# Patient Record
Sex: Female | Born: 1965 | Race: White | Hispanic: No | Marital: Married | Smoking: Former smoker
Health system: Southern US, Community
[De-identification: ages and names within clinical notes are randomized; demographics above are authoritative.]

## PROBLEM LIST (undated history)

## (undated) DIAGNOSIS — G4730 Sleep apnea, unspecified: Secondary | ICD-10-CM

## (undated) DIAGNOSIS — K602 Anal fissure, unspecified: Secondary | ICD-10-CM

## (undated) DIAGNOSIS — F329 Major depressive disorder, single episode, unspecified: Secondary | ICD-10-CM

## (undated) DIAGNOSIS — I10 Essential (primary) hypertension: Secondary | ICD-10-CM

## (undated) DIAGNOSIS — K219 Gastro-esophageal reflux disease without esophagitis: Secondary | ICD-10-CM

## (undated) DIAGNOSIS — D649 Anemia, unspecified: Secondary | ICD-10-CM

## (undated) DIAGNOSIS — K589 Irritable bowel syndrome without diarrhea: Secondary | ICD-10-CM

## (undated) DIAGNOSIS — T7840XA Allergy, unspecified, initial encounter: Secondary | ICD-10-CM

## (undated) DIAGNOSIS — F419 Anxiety disorder, unspecified: Secondary | ICD-10-CM

## (undated) DIAGNOSIS — M797 Fibromyalgia: Secondary | ICD-10-CM

## (undated) DIAGNOSIS — J45909 Unspecified asthma, uncomplicated: Secondary | ICD-10-CM

## (undated) DIAGNOSIS — F32A Depression, unspecified: Secondary | ICD-10-CM

## (undated) DIAGNOSIS — I498 Other specified cardiac arrhythmias: Secondary | ICD-10-CM

## (undated) DIAGNOSIS — K5790 Diverticulosis of intestine, part unspecified, without perforation or abscess without bleeding: Secondary | ICD-10-CM

## (undated) DIAGNOSIS — M1990 Unspecified osteoarthritis, unspecified site: Secondary | ICD-10-CM

## (undated) HISTORY — DX: Unspecified asthma, uncomplicated: J45.909

## (undated) HISTORY — DX: Gastro-esophageal reflux disease without esophagitis: K21.9

## (undated) HISTORY — PX: LEEP: SHX91

## (undated) HISTORY — DX: Sleep apnea, unspecified: G47.30

## (undated) HISTORY — DX: Anal fissure, unspecified: K60.2

## (undated) HISTORY — DX: Anemia, unspecified: D64.9

## (undated) HISTORY — DX: Diverticulosis of intestine, part unspecified, without perforation or abscess without bleeding: K57.90

## (undated) HISTORY — DX: Fibromyalgia: M79.7

## (undated) HISTORY — PX: ABDOMINAL HYSTERECTOMY: SHX81

## (undated) HISTORY — DX: Other specified cardiac arrhythmias: I49.8

## (undated) HISTORY — DX: Depression, unspecified: F32.A

## (undated) HISTORY — DX: Allergy, unspecified, initial encounter: T78.40XA

## (undated) HISTORY — DX: Anxiety disorder, unspecified: F41.9

## (undated) HISTORY — DX: Unspecified osteoarthritis, unspecified site: M19.90

## (undated) HISTORY — PX: COLONOSCOPY: SHX174

## (undated) HISTORY — DX: Irritable bowel syndrome, unspecified: K58.9

## (undated) HISTORY — DX: Essential (primary) hypertension: I10

---
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
## 2013-01-12 HISTORY — PX: UPPER GASTROINTESTINAL ENDOSCOPY: SHX188

## 2018-07-07 ENCOUNTER — Other Ambulatory Visit: Payer: Self-pay | Admitting: Family Medicine

## 2018-07-07 DIAGNOSIS — Z1231 Encounter for screening mammogram for malignant neoplasm of breast: Secondary | ICD-10-CM

## 2018-08-19 ENCOUNTER — Other Ambulatory Visit: Payer: Self-pay

## 2018-08-19 ENCOUNTER — Ambulatory Visit
Admission: RE | Admit: 2018-08-19 | Discharge: 2018-08-19 | Disposition: A | Discharge status: Home / Self Care | Payer: Self-pay | Source: Ambulatory Visit | Attending: Family Medicine | Admitting: Family Medicine

## 2018-08-19 DIAGNOSIS — Z1231 Encounter for screening mammogram for malignant neoplasm of breast: Secondary | ICD-10-CM

## 2018-09-30 ENCOUNTER — Other Ambulatory Visit: Payer: Self-pay | Admitting: Family Medicine

## 2018-09-30 ENCOUNTER — Ambulatory Visit
Admission: RE | Admit: 2018-09-30 | Discharge: 2018-09-30 | Disposition: A | Discharge status: Home / Self Care | Payer: BC Managed Care – PPO | Source: Ambulatory Visit | Attending: Family Medicine | Admitting: Family Medicine

## 2018-09-30 DIAGNOSIS — M79671 Pain in right foot: Secondary | ICD-10-CM

## 2018-11-09 ENCOUNTER — Emergency Department (HOSPITAL_COMMUNITY): Payer: BC Managed Care – PPO

## 2018-11-09 ENCOUNTER — Encounter (HOSPITAL_COMMUNITY): Payer: Self-pay | Admitting: Emergency Medicine

## 2018-11-09 ENCOUNTER — Other Ambulatory Visit: Payer: Self-pay

## 2018-11-09 ENCOUNTER — Emergency Department (HOSPITAL_COMMUNITY)
Admission: EM | Admit: 2018-11-09 | Discharge: 2018-11-09 | Disposition: A | Discharge status: Home / Self Care | Payer: BC Managed Care – PPO | Attending: Emergency Medicine | Admitting: Emergency Medicine

## 2018-11-09 DIAGNOSIS — Z79899 Other long term (current) drug therapy: Secondary | ICD-10-CM | POA: Insufficient documentation

## 2018-11-09 DIAGNOSIS — R0789 Other chest pain: Secondary | ICD-10-CM | POA: Insufficient documentation

## 2018-11-09 LAB — BASIC METABOLIC PANEL
Anion gap: 10 (ref 5–15)
BUN: 16 mg/dL (ref 6–20)
CO2: 24 mmol/L (ref 22–32)
Calcium: 9.8 mg/dL (ref 8.9–10.3)
Chloride: 106 mmol/L (ref 98–111)
Creatinine, Ser: 0.9 mg/dL (ref 0.44–1.00)
GFR calc Af Amer: >60 mL/min (ref >60)
GFR calc non Af Amer: >60 mL/min (ref >60)
Glucose, Bld: 102 mg/dL — ABNORMAL HIGH (ref 70–99)
Potassium: 3.8 mmol/L (ref 3.5–5.1)
Sodium: 140 mmol/L (ref 135–145)

## 2018-11-09 LAB — CBC
HCT: 40.8 % (ref 36.0–46.0)
Hemoglobin: 12.8 g/dL (ref 12.0–15.0)
MCH: 26.6 pg (ref 26.0–34.0)
MCHC: 31.4 g/dL (ref 30.0–36.0)
MCV: 84.8 fL (ref 80.0–100.0)
Platelets: 279 10*3/uL (ref 150–400)
RBC: 4.81 MIL/uL (ref 3.87–5.11)
RDW: 13.9 % (ref 11.5–15.5)
WBC: 5.7 10*3/uL (ref 4.0–10.5)
nRBC: 0 % (ref 0.0–0.2)

## 2018-11-09 LAB — TROPONIN I (HIGH SENSITIVITY)
Troponin I (High Sensitivity): <2 ng/L (ref <18)
Troponin I (High Sensitivity): <2 ng/L (ref <18)

## 2018-11-09 LAB — I-STAT BETA HCG BLOOD, ED (NOT ORDERABLE): I-stat hCG, quantitative: 7.3 m[IU]/mL — ABNORMAL HIGH (ref <5)

## 2018-11-09 MED ORDER — SODIUM CHLORIDE 0.9% FLUSH: 3.0000 mL | Freq: Once | INTRAVENOUS | Status: DC

## 2018-11-09 NOTE — ED Triage Notes (Signed)
Patient here from home with complaints of right sided chest pain that started at 1:30am. Nausea.

## 2018-11-09 NOTE — ED Provider Notes (Signed)
[facility] DEPT Provider Note   CSN: [id number] Arrival date & time: 11/09/18  0303     History   Chief Complaint Chief Complaint  Patient presents with  . Chest Pain    HPI Rebecca Gentry is a 53 y.o. female.     The history is provided by the patient.  Chest Pain Pain location:  R chest Pain quality: aching   Pain radiates to:  Does not radiate Pain severity:  Mild Onset quality:  Gradual Duration:  7 days Timing:  Constant Progression:  Partially resolved Chronicity:  New Context: not breathing and not raising an arm   Relieved by:  Nothing Worsened by:  Nothing Associated symptoms: no abdominal pain, no back pain, no cough, no fever, no palpitations, no shortness of breath and no vomiting   Risk factors: hypertension   Risk factors: no coronary artery disease, no diabetes mellitus, no high cholesterol and no prior DVT/PE     History reviewed. No pertinent past medical history.  There are no active problems to display for this patient.   History reviewed. No pertinent surgical history.   OB History   No obstetric history on file.      Home Medications    Prior to Admission medications   Medication Sig Start Date End Date Taking? Authorizing Provider  Albuterol Sulfate (PROAIR RESPICLICK) 123XX123 (90 Base) MCG/ACT AEPB Inhale 1 puff into the lungs every 4 (four) hours as needed (sob).   Yes [provider]  amLODipine (NORVASC) 5 MG tablet Take 5 mg by mouth daily as needed (chest pain).   Yes [provider]  calcium carbonate (OS-CAL) 1250 (500 Ca) MG chewable tablet Chew 1 tablet by mouth 2 (two) times daily.   Yes [provider]  cetirizine (ZYRTEC) 10 MG tablet Take 10 mg by mouth daily.   Yes [provider]  desipramine (NORPRAMIN) 25 MG tablet Take 25 mg by mouth daily.   Yes [provider]  diclofenac sodium (VOLTAREN) 1 % GEL Apply 2 g topically daily.   Yes [provider]  dicyclomine (BENTYL) 20 MG tablet Take 20 mg by mouth daily as needed for spasms.   Yes [provider]  fluticasone (FLONASE) 50 MCG/ACT nasal spray Place 1 spray into both nostrils 2 (two) times daily.   Yes [provider]  Magnesium 200 MG TABS Take 200 mg by mouth daily.   Yes [provider]  meclizine (ANTIVERT) 25 MG tablet Take 25 mg by mouth 3 (three) times daily as needed for dizziness.   Yes [provider]  mometasone-formoterol (DULERA) 100-5 MCG/ACT AERO Inhale 2 puffs into the lungs 2 (two) times daily.   Yes [provider]  Multiple Vitamin (MULTIVITAMIN WITH MINERALS) TABS tablet Take 1 tablet by mouth daily.   Yes [provider]  naltrexone (DEPADE) 50 MG tablet Take 75 mg by mouth daily.   Yes [provider]  nebivolol (BYSTOLIC) 5 MG tablet Take 5 mg by mouth daily.   Yes [provider]  nitrofurantoin (MACRODANTIN) 50 MG capsule Take 50 mg by mouth daily as needed (bladder infections).   Yes [provider]  omeprazole (PRILOSEC) 40 MG capsule Take 40 mg by mouth daily.   Yes [provider]  Potassium 99 MG TABS Take 99 mg by mouth daily.   Yes [provider]  Probiotic Product (ALIGN) 4 MG CAPS Take 1 capsule by mouth daily.   Yes [provider]  tizanidine (ZANAFLEX) 2 MG capsule Take 2 mg by mouth 2 (two) times daily.   Yes [provider]  Turmeric (QC TUMERIC COMPLEX PO) Take 300 mg by mouth daily.   Yes [provider]  vitamin B-12 (CYANOCOBALAMIN) 1000 MCG tablet Take 3,000 mcg by mouth daily.   Yes [provider]  Vitamin D, Cholecalciferol, 10 MCG (400 UNIT) CAPS Take 1 capsule by mouth daily.   Yes [provider]    Family History No family history on file.  Social History Social History   Tobacco Use  . Smoking status: Never Smoker  . Smokeless tobacco: Never Used  Substance Use Topics  .  Alcohol use: Never    Frequency: Never  . Drug use: Never     Allergies   Penicillin g and Apple   Review of Systems Review of Systems  Constitutional: Negative for chills and fever.  HENT: Negative for ear pain and sore throat.   Eyes: Negative for pain and visual disturbance.  Respiratory: Negative for cough and shortness of breath.   Cardiovascular: Positive for chest pain. Negative for palpitations.  Gastrointestinal: Negative for abdominal pain and vomiting.  Genitourinary: Negative for dysuria and hematuria.  Musculoskeletal: Negative for arthralgias and back pain.  Skin: Negative for color change and rash.  Neurological: Negative for seizures and syncope.  All other systems reviewed and are negative.    Physical Exam Updated Vital Signs  ED Triage Vitals  Enc Vitals Group     BP 11/09/18 0625 138/82     Pulse Rate 11/09/18 0625 (!) 55     Resp 11/09/18 0625 15     Temp 11/09/18 0629 98 F (36.7 C)     Temp Source 11/09/18 0629 Oral     SpO2 11/09/18 0625 100 %     Weight --      Height --      Head Circumference --      Peak Flow --      Pain Score 11/09/18 0321 5     Pain Loc --      Pain Edu? --      Excl. in [place]? --     Physical Exam Vitals signs and nursing note reviewed.  Constitutional:      General: She is not in acute distress.    Appearance: She is well-developed.  HENT:     Head: Normocephalic and atraumatic.  Eyes:     Extraocular Movements: Extraocular movements intact.     Conjunctiva/sclera: Conjunctivae normal.     Pupils: Pupils are equal, round, and reactive to light.  Neck:     Musculoskeletal: Normal range of motion and neck supple.  Cardiovascular:     Rate and Rhythm: Normal rate and regular rhythm.     Pulses:          Carotid pulses are 2+ on the right side and 2+ on the left side.      Radial pulses are 2+ on the right side and 2+ on the left side.     Heart sounds: Normal heart sounds. No murmur.  Pulmonary:      Effort: Pulmonary effort is normal. No respiratory distress.     Breath sounds: No decreased breath sounds, wheezing, rhonchi or rales.  Abdominal:     Palpations: Abdomen is soft.     Tenderness: There is no abdominal tenderness.  Musculoskeletal: Normal range of motion.     Right lower leg: No edema.  Left lower leg: No edema.  Skin:    General: Skin is warm and dry.     Capillary Refill: Capillary refill takes less than 2 seconds.  Neurological:     General: No focal deficit present.     Mental Status: She is alert.  Psychiatric:        Mood and Affect: Mood normal.      ED Treatments / Results  Labs (all labs ordered are listed, but only abnormal results are displayed) Labs Reviewed  BASIC METABOLIC PANEL - Abnormal; Notable for the following components:      Result Value   Glucose, Bld 102 (*)    All other components within normal limits  I-STAT BETA HCG BLOOD, ED (NOT ORDERABLE) - Abnormal; Notable for the following components:   I-stat hCG, quantitative 7.3 (*)    All other components within normal limits  CBC  I-STAT BETA HCG BLOOD, ED ([facility], [facility], [facility] ONLY)  TROPONIN I (HIGH SENSITIVITY)  TROPONIN I (HIGH SENSITIVITY)    EKG EKG Interpretation  Date/Time:  Wednesday November 09 2018 03:16:37 EDT Ventricular Rate:  55 PR Interval:    QRS Duration: 94 QT Interval:  429 QTC Calculation: 411 R Axis:   75 Text Interpretation: Sinus rhythm Confirmed by Lennice Sites [id number]) on 11/09/2018 7:00:35 AM   Radiology Dg Chest 2 View  Result Date: 11/09/2018 CLINICAL DATA:  Sudden onset mid chest pain EXAM: CHEST - 2 VIEW COMPARISON:  None. FINDINGS: The heart size and mediastinal contours are within normal limits. Both lungs are clear. The visualized skeletal structures are unremarkable. IMPRESSION: No active cardiopulmonary disease. Electronically Signed   By: Inez Catalina M.D.   On: 11/09/2018 03:43    Procedures Procedures (including critical care time)   Medications Ordered in ED Medications  sodium chloride flush (NS) 0.9 % injection 3 mL (has no administration in time range)     Initial Impression / Assessment and Plan / ED Course  I have reviewed the triage vital signs and the nursing notes.  Pertinent labs & imaging results that were available during my care of the patient were reviewed by me and considered in my medical decision making (see chart for details).     Rebecca Gentry is a 53 year old female with history of hypertension who presents to the ED with chest pain.  Patient states that chest pain has been there for the last 8 to 10 hours.  Mostly subsided now.  Patient woke up with some right-sided pain but overall nonspecific.  Heart score is 2.  EKG shows sinus rhythm.  No ischemic changes.  Troponin negative x2.  Patient is Wells criteria 0 and doubt PE.  Chest x-ray showed no signs of pneumonia, no pneumothorax, no pleural effusion.  No significant anemia, electrolyte abnormality, kidney injury.  Overall, no obvious cardiac or pulmonary cause of pain.  Possibly MSK versus GI related.  Recommend follow-up with primary care doctor.  Discharged in good condition.  Understands return precautions.  This chart was dictated using voice recognition software.  Despite best efforts to proofread,  errors can occur which can change the documentation meaning.    Final Clinical Impressions(s) / ED Diagnoses   Final diagnoses:  Atypical chest pain    ED Discharge Orders    None       Lennice Sites, DO 11/09/18 [id number]

## 2018-11-19 ENCOUNTER — Encounter (HOSPITAL_COMMUNITY): Payer: Self-pay | Admitting: Emergency Medicine

## 2018-11-19 ENCOUNTER — Emergency Department (HOSPITAL_COMMUNITY): Payer: BC Managed Care – PPO

## 2018-11-19 ENCOUNTER — Other Ambulatory Visit: Payer: Self-pay

## 2018-11-19 ENCOUNTER — Emergency Department (HOSPITAL_COMMUNITY)
Admission: EM | Admit: 2018-11-19 | Discharge: 2018-11-19 | Disposition: A | Discharge status: Home / Self Care | Payer: BC Managed Care – PPO | Attending: Emergency Medicine | Admitting: Emergency Medicine

## 2018-11-19 DIAGNOSIS — K625 Hemorrhage of anus and rectum: Secondary | ICD-10-CM | POA: Diagnosis present

## 2018-11-19 DIAGNOSIS — Z79899 Other long term (current) drug therapy: Secondary | ICD-10-CM | POA: Insufficient documentation

## 2018-11-19 DIAGNOSIS — K529 Noninfective gastroenteritis and colitis, unspecified: Secondary | ICD-10-CM | POA: Insufficient documentation

## 2018-11-19 LAB — COMPREHENSIVE METABOLIC PANEL
ALT: 42 U/L (ref 0–44)
AST: 38 U/L (ref 15–41)
Albumin: 4 g/dL (ref 3.5–5.0)
Alkaline Phosphatase: 68 U/L (ref 38–126)
Anion gap: 10 (ref 5–15)
BUN: 10 mg/dL (ref 6–20)
CO2: 26 mmol/L (ref 22–32)
Calcium: 9.4 mg/dL (ref 8.9–10.3)
Chloride: 106 mmol/L (ref 98–111)
Creatinine, Ser: 0.77 mg/dL (ref 0.44–1.00)
GFR calc Af Amer: >60 mL/min (ref >60)
GFR calc non Af Amer: >60 mL/min (ref >60)
Glucose, Bld: 101 mg/dL — ABNORMAL HIGH (ref 70–99)
Potassium: 3.8 mmol/L (ref 3.5–5.1)
Sodium: 142 mmol/L (ref 135–145)
Total Bilirubin: 0.7 mg/dL (ref 0.3–1.2)
Total Protein: 7.1 g/dL (ref 6.5–8.1)

## 2018-11-19 LAB — CBC WITH DIFFERENTIAL/PLATELET
Abs Immature Granulocytes: 0.01 10*3/uL (ref 0.00–0.07)
Basophils Absolute: 0 10*3/uL (ref 0.0–0.1)
Basophils Relative: 0 %
Eosinophils Absolute: 0 10*3/uL (ref 0.0–0.5)
Eosinophils Relative: 1 %
HCT: 42.2 % (ref 36.0–46.0)
Hemoglobin: 13.3 g/dL (ref 12.0–15.0)
Immature Granulocytes: 0 %
Lymphocytes Relative: 26 %
Lymphs Abs: 1.6 10*3/uL (ref 0.7–4.0)
MCH: 27.1 pg (ref 26.0–34.0)
MCHC: 31.5 g/dL (ref 30.0–36.0)
MCV: 86.1 fL (ref 80.0–100.0)
Monocytes Absolute: 0.5 10*3/uL (ref 0.1–1.0)
Monocytes Relative: 7 %
Neutro Abs: 4 10*3/uL (ref 1.7–7.7)
Neutrophils Relative %: 66 %
Platelets: 198 10*3/uL (ref 150–400)
RBC: 4.9 MIL/uL (ref 3.87–5.11)
RDW: 14.2 % (ref 11.5–15.5)
WBC: 6.1 10*3/uL (ref 4.0–10.5)
nRBC: 0 % (ref 0.0–0.2)

## 2018-11-19 LAB — URINALYSIS, ROUTINE W REFLEX MICROSCOPIC
Bilirubin Urine: NEGATIVE
Glucose, UA: NEGATIVE mg/dL
Hgb urine dipstick: NEGATIVE
Ketones, ur: NEGATIVE mg/dL
Leukocytes,Ua: NEGATIVE
Nitrite: NEGATIVE
Protein, ur: NEGATIVE mg/dL
Specific Gravity, Urine: 1.017 (ref 1.005–1.030)
pH: 5 (ref 5.0–8.0)

## 2018-11-19 LAB — PROTIME-INR
INR: 0.9 (ref 0.8–1.2)
Prothrombin Time: 12.3 seconds (ref 11.4–15.2)

## 2018-11-19 LAB — TYPE AND SCREEN
ABO/RH(D): O NEG
Antibody Screen: NEGATIVE

## 2018-11-19 LAB — LIPASE, BLOOD: Lipase: 30 U/L (ref 11–51)

## 2018-11-19 LAB — POC OCCULT BLOOD, ED: Fecal Occult Bld: POSITIVE — AB

## 2018-11-19 LAB — ABO/RH: ABO/RH(D): O NEG

## 2018-11-19 MED ORDER — CIPROFLOXACIN HCL 500 MG PO TABS: 500.0000 mg | ORAL_TABLET | Freq: Two times a day (BID) | ORAL | 0 refills | Status: DC

## 2018-11-19 MED ORDER — IOHEXOL 300 MG/ML  SOLN
100.0000 mL | Freq: Once | INTRAMUSCULAR | Status: AC | PRN
  Administered 2018-11-19: 10:00:00 100 mL via INTRAVENOUS

## 2018-11-19 MED ORDER — DICYCLOMINE HCL 20 MG PO TABS: 20.0000 mg | ORAL_TABLET | Freq: Once | ORAL | 0 refills | Status: DC

## 2018-11-19 NOTE — ED Triage Notes (Signed)
Patient complains of blood stool/diarrhea since 1 pm yesterday, has had hourly BM's that have increased to q 30 min. They were sightly formed with bright red blood, now patient reports straight blood in the toilet. Abd pain while having a BM, pain 6/10 while resting. Reports taking motrin q AM for her fibromyalgia.

## 2018-11-19 NOTE — ED Provider Notes (Signed)
[facility] DEPT Provider Note   CSN: [id number] Arrival date & time: 11/19/18  0556     History   Chief Complaint Chief Complaint  Patient presents with  . Rectal Bleeding    HPI Rebecca Gentry is a 53 y.o. female with history of fibromyalgia and hemorrhoids presents with rectal bleeding. She states that yesterday she started to have some diarrhea with bright red blood streaks in it. She has a hx of hemorrhoids so was not too concerned however overnight the diarrhea became more frequent and now she is just passing gross blood. It passes every 30 minutes. She reports associated abdominal cramping when she has to go to the bathroom. She had an episodes of N/V yesterday one time and reports decreased appetite. She had a fever of 100.8 earlier in the week which has resolved. She had a COVID test done but doesn't know the results yet. She was also here for chest pain a couple weeks ago. She states that has resolved. She reports associated fatigue and lightheadedness. She denies headache, syncope, chest pain, SOB, cough, urinary symptoms. 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 She takes Ibuprofen and ASA prn for her fibromyalgia     HPI  History reviewed. No pertinent past medical history.  There are no active problems to display for this patient.   History reviewed. No pertinent surgical history.   OB History   No obstetric history on file.      Home Medications    Prior to Admission medications   Medication Sig Start Date End Date Taking? Authorizing Provider  Albuterol Sulfate (PROAIR RESPICLICK) 123XX123 (90 Base) MCG/ACT AEPB Inhale 1 puff into the lungs every 4 (four) hours as needed (sob).    [provider]  amLODipine (NORVASC) 5 MG tablet Take 5 mg by mouth daily as needed (chest pain).    [provider]  calcium carbonate (OS-CAL) 1250 (500 Ca) MG chewable  tablet Chew 1 tablet by mouth 2 (two) times daily.    [provider]  cetirizine (ZYRTEC) 10 MG tablet Take 10 mg by mouth daily.    [provider]  desipramine (NORPRAMIN) 25 MG tablet Take 25 mg by mouth daily.    [provider]  diclofenac sodium (VOLTAREN) 1 % GEL Apply 2 g topically daily.    [provider]  dicyclomine (BENTYL) 20 MG tablet Take 20 mg by mouth daily as needed for spasms.    [provider]  fluticasone (FLONASE) 50 MCG/ACT nasal spray Place 1 spray into both nostrils 2 (two) times daily.    [provider]  Magnesium 200 MG TABS Take 200 mg by mouth daily.    [provider]  meclizine (ANTIVERT) 25 MG tablet Take 25 mg by mouth 3 (three) times daily as needed for dizziness.    [provider]  mometasone-formoterol (DULERA) 100-5 MCG/ACT AERO Inhale 2 puffs into the lungs 2 (two) times daily.    [provider]  Multiple Vitamin (MULTIVITAMIN WITH MINERALS) TABS tablet Take 1 tablet by mouth daily.    [provider]  naltrexone (DEPADE) 50 MG tablet Take 75 mg by mouth daily.    [provider]  nebivolol (BYSTOLIC) 5 MG tablet Take 5 mg by mouth daily.    [provider]  nitrofurantoin (MACRODANTIN) 50 MG capsule Take 50 mg by mouth daily as needed (bladder infections).    [provider]  omeprazole (PRILOSEC) 40 MG capsule Take 40 mg by mouth daily.    [provider]  Potassium 99 MG TABS Take 99 mg by mouth daily.    [provider]  Probiotic Product (ALIGN) 4 MG CAPS Take 1 capsule by mouth daily.    [provider]  tizanidine (ZANAFLEX) 2 MG capsule Take 2 mg by mouth 2 (two) times daily.    [provider]  Turmeric (QC TUMERIC COMPLEX PO) Take 300 mg by mouth daily.    [provider]  vitamin B-12 (CYANOCOBALAMIN) 1000 MCG tablet Take 3,000 mcg by mouth daily.    [provider]  Vitamin  D, Cholecalciferol, 10 MCG (400 UNIT) CAPS Take 1 capsule by mouth daily.    [provider]    Family History No family history on file.  Social History Social History   Tobacco Use  . Smoking status: Never Smoker  . Smokeless tobacco: Never Used  Substance Use Topics  . Alcohol use: Never    Frequency: Never  . Drug use: Never     Allergies   Penicillin g and Apple   Review of Systems Review of Systems  Constitutional: Positive for fatigue. Negative for chills and fever.  Respiratory: Negative for shortness of breath.   Cardiovascular: Negative for chest pain.  Gastrointestinal: Positive for abdominal pain, blood in stool, diarrhea, nausea and vomiting.  Genitourinary: Negative for dysuria, frequency and pelvic pain.  Neurological: Positive for light-headedness. Negative for syncope and headaches.  Hematological: Does not bruise/bleed easily.  All other systems reviewed and are negative.    Physical Exam Updated Vital Signs BP (!) 174/84 (BP Location: Left Arm)   Pulse 82   Temp 98.2 F (36.8 C) (Oral)   Resp 18   SpO2 98%   Physical Exam Vitals signs and nursing note reviewed.  Constitutional:      General: She is not in acute distress.    Appearance: Normal appearance. She is well-developed. She is not ill-appearing.  HENT:     Head: Normocephalic and atraumatic.  Eyes:     General: No scleral icterus.       Right eye: No discharge.        Left eye: No discharge.     Conjunctiva/sclera: Conjunctivae normal.     Pupils: Pupils are equal, round, and reactive to light.     Comments: Normal conjunctiva  Neck:     Musculoskeletal: Normal range of motion.  Cardiovascular:     Rate and Rhythm: Normal rate and regular rhythm.  Pulmonary:     Effort: Pulmonary effort is normal. No respiratory distress.     Breath sounds: Normal breath sounds.  Abdominal:     General: There is no distension.     Palpations: Abdomen is soft.     Tenderness: There  is no abdominal tenderness.  Genitourinary:    Comments: Rectal: No gross blood, fissures, redness, area of fluctuance, lesions, or tenderness. There is small, non-thrombosed or bleeding hemorrhoid in the 12 o clock region. Chaperone Herbie [place], NT) present during exam. Skin:    General: Skin is warm and dry.  Neurological:     Mental Status: She is alert and oriented to person, place, and time.  Psychiatric:        Behavior: Behavior normal.      ED Treatments / Results  Labs (all labs ordered are listed, but only abnormal results are displayed) Labs Reviewed  COMPREHENSIVE METABOLIC PANEL - Abnormal;  Notable for the following components:      Result Value   Glucose, Bld 101 (*)    All other components within normal limits  POC OCCULT BLOOD, ED - Abnormal; Notable for the following components:   Fecal Occult Bld POSITIVE (*)    All other components within normal limits  CBC WITH DIFFERENTIAL/PLATELET  LIPASE, BLOOD  URINALYSIS, ROUTINE W REFLEX MICROSCOPIC  PROTIME-INR  TYPE AND SCREEN  ABO/RH    EKG None  Radiology No results found.  Procedures Procedures (including critical care time)  Medications Ordered in ED Medications - No data to display   Initial Impression / Assessment and Plan / ED Course  I have reviewed the triage vital signs and the nursing notes.  Pertinent labs & imaging results that were available during my care of the patient were reviewed by me and considered in my medical decision making (see chart for details).  53 year old female presents with rectal bleeding and abdominal cramping. She is bradycardic but otherwise vitals are normal. Heart is regular rate and rhythm. Lungs are CTA. Abdomen is soft and non-tender. Rectal exam is remarkable for non-thrombosed hemorrhoids. No gross bleeding on exam. Will order labs.  CBC is normal. CMP is normal. Hemoccult is positive. Shared visit with Dr. Maryan Rued. Due to mild abdominal cramping will order CT  abdomen/pelvis to further evaluate.  CT shows colitis of the mid-distal descending colon. Likely infectious since she had a fever earlier in the week although cannot rule out IBD. Will treat with Cipro and have her f/u with her PCP and GI. Return precautions discussed.  Final Clinical Impressions(s) / ED Diagnoses   Final diagnoses:  Colitis  Rectal bleeding    ED Discharge Orders    None       Recardo Evangelist, PA-C 11/19/18 1223    Blanchie Dessert, MD 11/19/18 1546

## 2018-11-19 NOTE — Discharge Instructions (Signed)
Start Cipro 500mg  twice daily for one week Take Bentyl once daily for abdominal spasms Please follow up with your doctor and with gastroenterology

## 2018-11-30 ENCOUNTER — Encounter: Payer: Self-pay | Admitting: Gastroenterology

## 2019-01-10 ENCOUNTER — Encounter: Payer: Self-pay | Admitting: Gastroenterology

## 2019-01-10 ENCOUNTER — Ambulatory Visit: Payer: BC Managed Care – PPO | Admitting: Gastroenterology

## 2019-01-10 ENCOUNTER — Other Ambulatory Visit: Payer: Self-pay

## 2019-01-10 VITALS — BP 102/68 | HR 64 | Temp 97.9°F | Ht 65.75 in | Wt 161.0 lb

## 2019-01-10 DIAGNOSIS — K219 Gastro-esophageal reflux disease without esophagitis: Secondary | ICD-10-CM

## 2019-01-10 DIAGNOSIS — R109 Unspecified abdominal pain: Secondary | ICD-10-CM | POA: Diagnosis not present

## 2019-01-10 DIAGNOSIS — K625 Hemorrhage of anus and rectum: Secondary | ICD-10-CM

## 2019-01-10 DIAGNOSIS — R933 Abnormal findings on diagnostic imaging of other parts of digestive tract: Secondary | ICD-10-CM | POA: Diagnosis not present

## 2019-01-10 DIAGNOSIS — Z1159 Encounter for screening for other viral diseases: Secondary | ICD-10-CM

## 2019-01-10 MED ORDER — OMEPRAZOLE 20 MG PO CPDR: 20.0000 mg | DELAYED_RELEASE_CAPSULE | Freq: Every day | ORAL | 3 refills | Status: DC | PRN

## 2019-01-10 MED ORDER — NA SULFATE-K SULFATE-MG SULF 17.5-3.13-1.6 GM/177ML PO SOLN: 1.0000 | Freq: Once | ORAL | 0 refills | Status: AC

## 2019-01-10 NOTE — Patient Instructions (Addendum)
If you are age 52 or older, your body mass index should be between 23-30. Your Body mass index is 26.18 kg/m. If this is out of the aforementioned range listed, please consider follow up with your Primary Care Provider.  If you are age 3 or younger, your body mass index should be between 19-25. Your Body mass index is 26.18 kg/m. If this is out of the aformentioned range listed, please consider follow up with your Primary Care Provider.   Reduce Omeprazole 20 mg 1-2 capsules daily as needed.

## 2019-01-10 NOTE — Progress Notes (Signed)
HPI :  53 year old female with a history of GERD, colitis, hypertension, referred here by Horald Pollen MD for symptoms of rectal bleeding, bowel changes, and abnormal CT scan of the abdomen.  She states in the first week of November she developed bloody diarrhea which progressed into pure hematochezia over the matter of hours.  This was associated with acute onset of severe abdominal pain.  This led her to the emergency department at which time she had a CT scan showing inflammation of the distal descending colon consistent with colitis.  She denied any fevers at this time.  She was given a course of antibiotics and she states her symptoms stopped on their own less than 48 hours after onset although the pain had persisted for some time.  Since that time her bowels have been regular.  She does not have much of any abdominal pain.  She does endorse historically symptoms of irritable bowel syndrome with alternating loose stools and constipation.  She takes align daily which keeps her stools regular and she takes Bentyl as needed which is worked well for her.  She has used ibuprofen on a daily basis over time as well as taking an aspirin a few days a week.  She denies any new medications around the onset of her colitis.  In regards to her reflux she takes omeprazole 40 mg every day.  She states generally it works well, she has rare breakthrough of reflux symptoms if she eats particular trigger foods.  No dysphagia or diet aphasia.  She has not tried a low-dose of omeprazole and has been on it since 2005.  She states she had an EGD done in [place] state in 2017, she states she was told she had a hiatal hernia but no evidence of Barrett's esophagus.  She denies any family history of esophageal cancer.  Her last colonoscopy was performed in 2011, also in [place] state, no report available, she thinks it was normal.  She denies any family history of colon cancer.   CT abdomen / pelvis - 11/19/18 -  IMPRESSION: Bowel wall thickening and inflammatory changes throughout the mid to distal descending colon. Findings most likely represent colitis of infectious or inflammatory etiology.  FOBT (+) 11/19/18 Other labs normal    Past Medical History:  Diagnosis Date  . Anal fissure   . Anemia   . Anxiety   . Arthritis   . Asthma   . Depression   . Diverticulosis   . Fibromyalgia   . GERD (gastroesophageal reflux disease)   . HTN (hypertension)   . Irritable bowel syndrome   . Sinus arrhythmia   . Sleep apnea      Past Surgical History:  Procedure Laterality Date  . ABDOMINAL HYSTERECTOMY    . LEEP     Family History  Problem Relation Age of Onset  . Breast cancer Mother   . Heart disease Mother   . Irritable bowel syndrome Mother   . Kidney disease Mother   . Lung cancer Brother   . Breast cancer Paternal Aunt    Social History   Tobacco Use  . Smoking status: Former Smoker    Quit date: 1995    Years since quitting: 26.0  . Smokeless tobacco: Never Used  Substance Use Topics  . Alcohol use: Never  . Drug use: Never   Current Outpatient Medications  Medication Sig Dispense Refill  . Albuterol Sulfate (PROAIR RESPICLICK) 123XX123 (90 Base) MCG/ACT AEPB Inhale 1 puff into the lungs  every 4 (four) hours as needed (sob).    Marland Kitchen amLODipine (NORVASC) 5 MG tablet Take 5 mg by mouth daily as needed (chest pain).    Marland Kitchen aspirin 325 MG tablet Take 650 mg by mouth every 4 (four) hours as needed for mild pain or headache.    Marland Kitchen BROMELAINS PO Take 1 tablet by mouth daily.    . calcium carbonate (OS-CAL) 1250 (500 Ca) MG chewable tablet Chew 1 tablet by mouth 2 (two) times daily.    . cetirizine (ZYRTEC) 10 MG tablet Take 10 mg by mouth daily.    Marland Kitchen desipramine (NORPRAMIN) 25 MG tablet Take 25 mg by mouth daily.    . diclofenac sodium (VOLTAREN) 1 % GEL Apply 2 g topically daily.    . fluticasone (FLONASE) 50 MCG/ACT nasal spray Place 1 spray into both nostrils 2 (two) times daily.     Marland Kitchen ibuprofen (ADVIL) 200 MG tablet Take 400 mg by mouth every 6 (six) hours as needed for fever, headache or moderate pain.    Marland Kitchen loperamide (IMODIUM) 2 MG capsule Take 2 mg by mouth as needed for diarrhea or loose stools.    . Magnesium 200 MG TABS Take 200 mg by mouth daily.    . meclizine (ANTIVERT) 25 MG tablet Take 25 mg by mouth as needed for dizziness.    . mometasone-formoterol (DULERA) 100-5 MCG/ACT AERO Inhale 2 puffs into the lungs 2 (two) times daily.    . Multiple Vitamin (MULTIVITAMIN WITH MINERALS) TABS tablet Take 1 tablet by mouth daily.    . naltrexone (DEPADE) 50 MG tablet Take 75 mg by mouth daily.    . nebivolol (BYSTOLIC) 5 MG tablet Take 5 mg by mouth daily.    . nitrofurantoin (MACRODANTIN) 50 MG capsule Take 50 mg by mouth daily as needed (bladder infections).    Marland Kitchen omeprazole (PRILOSEC) 40 MG capsule Take 40 mg by mouth daily.    . ondansetron (ZOFRAN) 8 MG tablet Take 8 mg by mouth every 8 (eight) hours as needed for nausea/vomiting.    . polyvinyl alcohol (LIQUIFILM TEARS) 1.4 % ophthalmic solution Place 1 drop into both eyes as needed for dry eyes.    . Potassium 99 MG TABS Take 99 mg by mouth daily.    . Probiotic Product (ALIGN) 4 MG CAPS Take 1 capsule by mouth daily.    . tizanidine (ZANAFLEX) 2 MG capsule Take 2 mg by mouth daily.     . Turmeric (QC TUMERIC COMPLEX PO) Take 300 mg by mouth daily.    . vitamin B-12 (CYANOCOBALAMIN) 1000 MCG tablet Take 3,000 mcg by mouth daily.    . Vitamin D, Cholecalciferol, 10 MCG (400 UNIT) CAPS Take 1 capsule by mouth daily.    Marland Kitchen dicyclomine (BENTYL) 20 MG tablet Take 1 tablet (20 mg total) by mouth once for 1 dose. 10 tablet 0   No current facility-administered medications for this visit.   Allergies  Allergen Reactions  . Penicillin G     Other reaction(s): Other (See Comments) hives  . Apple Swelling     Review of Systems: All systems reviewed and negative except where noted in HPI.   Lab Results  Component  Value Date   WBC 6.1 11/19/2018   HGB 13.3 11/19/2018   HCT 42.2 11/19/2018   MCV 86.1 11/19/2018   PLT 198 11/19/2018    Lab Results  Component Value Date   CREATININE 0.77 11/19/2018   BUN 10 11/19/2018   NA 142  11/19/2018   K 3.8 11/19/2018   CL 106 11/19/2018   CO2 26 11/19/2018    Lab Results  Component Value Date   ALT 42 11/19/2018   AST 38 11/19/2018   ALKPHOS 68 11/19/2018   BILITOT 0.7 11/19/2018     Physical Exam: BP 102/68 (BP Location: Left Arm, Patient Position: Sitting, Cuff Size: Normal)   Pulse 64   Temp 97.9 F (36.6 C)   Ht 5' 5.75" (1.67 m) Comment: height measured without shoes  Wt 161 lb (73 kg)   BMI 26.18 kg/m  Constitutional: Pleasant,well-developed, female in no acute distress. HEENT: Normocephalic and atraumatic. Conjunctivae are normal. No scleral icterus. Neck supple.  Cardiovascular: Normal rate, regular rhythm.  Pulmonary/chest: Effort normal and breath sounds normal. No wheezing, rales or rhonchi. Abdominal: Soft, nondistended, nontender.  There are no masses palpable. No hepatomegaly. Extremities: no edema Lymphadenopathy: No cervical adenopathy noted. Neurological: Alert and oriented to person place and time. Skin: Skin is warm and dry. No rashes noted. Psychiatric: Normal mood and affect. Behavior is normal.   ASSESSMENT AND PLAN: 53 year old female here for reassessment the following:  Abnormal CT scan of the GI tract / rectal bleeding / abdominal pain - acute onset of abdominal pain and hematochezia in the setting of left-sided colitis on CT scan.  Symptoms resolved spontaneously less than 48 hours, the ER provided her a short course of antibiotics.  Based off symptoms I am most concerned that she may have had ischemic colitis, while infectious colitis or Crohn's seems less likely to me.  She has recovered and feeling back to her normal baseline, no further bleeding, pain has mostly resolved, and bowel habits fairly regular at  baseline.  I discussed options with her.  I am recommending a colonoscopy given this occurrence and her CT scan findings as her last exam was in 2011.  I discussed risks and benefits of colonoscopy and anesthesia and she wants to proceed.  Further recommendations pending results.  If she did have ischemic colitis, she takes a fair amount of NSAIDs which could predispose to this, and recommend she stop NSAIDs in the interim, she agreed.   GERD - longstanding reflux on omeprazole since 2005, this works pretty well to control her symptoms although I discussed long-term risks associated with chronic PPI use and recommend a lower dose if she can tolerate it.  She has not tried a low-dose in a long time, will switch her to 20 mg tablet, she can take 1 of these per day and hopefully it controls her.  If her symptoms are more frequent and bothersome to her she can increase to back to 40 mg a day.  She has a relatively recent EGD with no report of Barrett's esophagus from which she reports today.  [place] Cellar, MD [facility]  CC Darron Doom Maebelle Munroe, MD

## 2019-01-17 ENCOUNTER — Ambulatory Visit (INDEPENDENT_AMBULATORY_CARE_PROVIDER_SITE_OTHER): Payer: BC Managed Care – PPO

## 2019-01-17 ENCOUNTER — Other Ambulatory Visit: Payer: Self-pay | Admitting: Gastroenterology

## 2019-01-17 DIAGNOSIS — Z1159 Encounter for screening for other viral diseases: Secondary | ICD-10-CM

## 2019-01-17 LAB — SARS CORONAVIRUS 2 (TAT 6-24 HRS): SARS Coronavirus 2: NEGATIVE

## 2019-01-19 ENCOUNTER — Other Ambulatory Visit: Payer: Self-pay

## 2019-01-19 ENCOUNTER — Ambulatory Visit (AMBULATORY_SURGERY_CENTER): Payer: BC Managed Care – PPO | Admitting: Gastroenterology

## 2019-01-19 ENCOUNTER — Encounter: Payer: Self-pay | Admitting: Gastroenterology

## 2019-01-19 VITALS — BP 105/72 | HR 58 | Temp 97.9°F | Resp 16 | Ht 65.0 in | Wt 161.0 lb

## 2019-01-19 DIAGNOSIS — K5730 Diverticulosis of large intestine without perforation or abscess without bleeding: Secondary | ICD-10-CM | POA: Diagnosis not present

## 2019-01-19 DIAGNOSIS — K625 Hemorrhage of anus and rectum: Secondary | ICD-10-CM

## 2019-01-19 DIAGNOSIS — K635 Polyp of colon: Secondary | ICD-10-CM

## 2019-01-19 DIAGNOSIS — D122 Benign neoplasm of ascending colon: Secondary | ICD-10-CM

## 2019-01-19 DIAGNOSIS — R935 Abnormal findings on diagnostic imaging of other abdominal regions, including retroperitoneum: Secondary | ICD-10-CM

## 2019-01-19 MED ORDER — SODIUM CHLORIDE 0.9 % IV SOLN: 500.0000 mL | INTRAVENOUS | Status: DC

## 2019-01-19 NOTE — Progress Notes (Signed)
To PACU, VSS. Report to [email or web address] 

## 2019-01-19 NOTE — Progress Notes (Signed)
Called to room to assist during endoscopic procedure.  Patient ID and intended procedure confirmed with present staff. Received instructions for my participation in the procedure from the performing physician.  

## 2019-01-19 NOTE — Progress Notes (Signed)
Temp per June VS per Courtney 

## 2019-01-19 NOTE — Op Note (Signed)
[facility] Patient Name: Rebecca Gentry Procedure Date: 01/19/2019 8:02 AM MRN: [id number] Endoscopist: Remo Lipps P. Havery Moros , MD Age: 55 Referring MD:  Date of Birth: Aug 30, 1965 Gender: Female Account #: [id number] Procedure:                Colonoscopy Indications:              Rectal bleeding, Abnormal CT of the GI tract - left                            sided colitis in the setting of acute onset pain /                            bleeding, concerning for ischemic colitis Medicines:                Monitored Anesthesia Care Procedure:                Pre-Anesthesia Assessment:                           - Prior to the procedure, a History and Physical                            was performed, and patient medications and                            allergies were reviewed. The patient's tolerance of                            previous anesthesia was also reviewed. The risks                            and benefits of the procedure and the sedation                            options and risks were discussed with the patient.                            All questions were answered, and informed consent                            was obtained. Prior Anticoagulants: The patient has                            taken no previous anticoagulant or antiplatelet                            agents. ASA Grade Assessment: III - A patient with                            severe systemic disease. After reviewing the risks                            and benefits, the patient was deemed in  satisfactory condition to undergo the procedure.                           After obtaining informed consent, the colonoscope                            was passed under direct vision. Throughout the                            procedure, the patient's blood pressure, pulse, and                            oxygen saturations were monitored continuously. The                             Colonoscope was introduced through the anus and                            advanced to the the cecum, identified by                            appendiceal orifice and ileocecal valve. The                            colonoscopy was performed without difficulty. The                            patient tolerated the procedure well. The quality                            of the bowel preparation was unsatisfactory. The                            ileocecal valve, appendiceal orifice, and rectum                            were photographed. Scope In: 8:07:03 AM Scope Out: 8:22:26 AM Scope Withdrawal Time: 0 hours 9 minutes 59 seconds  Total Procedure Duration: 0 hours 15 minutes 23 seconds  Findings:                 The perianal and digital rectal examinations were                            normal.                           A moderate amount of semi-liquid stool was found in                            the entire colon, making visualization difficult.                            Lavage of the area was performed using copious  amounts of sterile water, resulting in incomplete                            clearance with fair visualization. The cecum could                            not be cleared, the colonoscope clogged due to                            residual seeds / nuts.                           A diminutive polyp was found in the ascending                            colon. The polyp was sessile. The polyp was removed                            with a cold snare. Resection and retrieval were                            complete.                           A few small-mouthed diverticula were found in the                            sigmoid colon.                           Anal papilla(e) were hypertrophied.                           The exam was otherwise without abnormality.                            Specifically, no inflammatory changes of the left                             colon appreciated in regards to CT scan. No                            evidence of IBD. Complications:            No immediate complications. Estimated blood loss:                            Minimal. Estimated Blood Loss:     Estimated blood loss was minimal. Impression:               - Preparation of the colon was unsatisfactory for                            screening purposes.                           -  One diminutive polyp in the ascending colon,                            removed with a cold snare. Resected and retrieved.                           - Diverticulosis in the sigmoid colon.                           - Anal papilla(e) were hypertrophied.                           - The examination was otherwise normal.                           No persistent inflammatory changes in regards to CT                            findings. Clinical history most consistent with                            ischemic colitis, while infectious colitis possible                            but I think less likely. Recommendation:           - Patient has a contact number available for                            emergencies. The signs and symptoms of potential                            delayed complications were discussed with the                            patient. Return to normal activities tomorrow.                            Written discharge instructions were provided to the                            patient.                           - Resume previous diet.                           - Continue present medications.                           - Continue to avoid NSAIDs given suspicion for                            ischemic colitis. Contact us with any recurrent  symptoms                           - Await pathology results. Remo Lipps P. Havery Moros, MD 01/19/2019 8:29:28 AM This report has been signed electronically.

## 2019-01-19 NOTE — Progress Notes (Signed)
Pt's states no medical or surgical changes since previsit or office visit. 

## 2019-01-19 NOTE — Patient Instructions (Signed)
NO NSAIDS.  TYLENOL ONLY. POLYP REMOVED TODAY. DIVERTICULOSIS NOTED.     YOU HAD AN ENDOSCOPIC PROCEDURE TODAY AT [facility]:   Refer to the procedure report that was given to you for any specific questions about what was found during the examination.  If the procedure report does not answer your questions, please call your gastroenterologist to clarify.  If you requested that your care partner not be given the details of your procedure findings, then the procedure report has been included in a sealed envelope for you to review at your convenience later.  YOU SHOULD EXPECT: Some feelings of bloating in the abdomen. Passage of more gas than usual.  Walking can help get rid of the air that was put into your GI tract during the procedure and reduce the bloating. If you had a lower endoscopy (such as a colonoscopy or flexible sigmoidoscopy) you may notice spotting of blood in your stool or on the toilet paper. If you underwent a bowel prep for your procedure, you may not have a normal bowel movement for a few days.  Please Note:  You might notice some irritation and congestion in your nose or some drainage.  This is from the oxygen used during your procedure.  There is no need for concern and it should clear up in a day or so.  SYMPTOMS TO REPORT IMMEDIATELY:   Following lower endoscopy (colonoscopy or flexible sigmoidoscopy):  Excessive amounts of blood in the stool  Significant tenderness or worsening of abdominal pains  Swelling of the abdomen that is new, acute  Fever of 100F or higher   For urgent or emergent issues, a gastroenterologist can be reached at any hour by calling [phone].   DIET:  We do recommend a small meal at first, but then you may proceed to your regular diet.  Drink plenty of fluids but you should avoid alcoholic beverages for 24 hours.  ACTIVITY:  You should plan to take it easy for the rest of today and you should NOT DRIVE or use heavy  machinery until tomorrow (because of the sedation medicines used during the test).    FOLLOW UP: Our staff will call the number listed on your records 48-72 hours following your procedure to check on you and address any questions or concerns that you may have regarding the information given to you following your procedure. If we do not reach you, we will leave a message.  We will attempt to reach you two times.  During this call, we will ask if you have developed any symptoms of COVID 19. If you develop any symptoms (ie: fever, flu-like symptoms, shortness of breath, cough etc.) before then, please call [phone].  If you test positive for Covid 19 in the 2 weeks post procedure, please call and report this information to [place].    If any biopsies were taken you will be contacted by phone or by letter within the next 1-3 weeks.  Please call us at [phone] if you have not heard about the biopsies in 3 weeks.    SIGNATURES/CONFIDENTIALITY: You and/or your care partner have signed paperwork which will be entered into your electronic medical record.  These signatures attest to the fact that that the information above on your After Visit Summary has been reviewed and is understood.  Full responsibility of the confidentiality of this discharge information lies with you and/or your care-partner.

## 2019-01-23 ENCOUNTER — Telehealth: Payer: Self-pay | Admitting: *Deleted

## 2019-01-23 NOTE — Telephone Encounter (Signed)
  Follow up Call-  Call back number 01/19/2019  Post procedure Call Back phone  # [phone]  Permission to leave phone message Yes     Patient questions:  Do you have a fever, pain , or abdominal swelling? No. Pain Score  0 *  Have you tolerated food without any problems? Yes.    Have you been able to return to your normal activities? Yes.    Do you have any questions about your discharge instructions: Diet   No. Medications  No. Follow up visit  No.  Do you have questions or concerns about your Care? No.  Actions: * If pain score is 4 or above: No action needed, pain <4.  1. Have you developed a fever since your procedure? no  2.   Have you had an respiratory symptoms (SOB or cough) since your procedure? no  3.   Have you tested positive for COVID 19 since your procedure no  4.   Have you had any family members/close contacts diagnosed with the COVID 19 since your procedure? no   If yes to any of these questions please route to Joylene John, RN and Alphonsa Gin, Therapist, sports.

## 2019-03-11 ENCOUNTER — Ambulatory Visit: Payer: BC Managed Care – PPO | Attending: Internal Medicine

## 2019-03-11 DIAGNOSIS — Z23 Encounter for immunization: Secondary | ICD-10-CM | POA: Insufficient documentation

## 2019-03-11 NOTE — Progress Notes (Signed)
   Covid-19 Vaccination Clinic  Name:  Rebecca Gentry    MRN: [id number] DOB: [birth year]  03/11/2019  [name] was observed post Covid-19 immunization for 15 minutes without incidence. She was provided with Vaccine Information Sheet and instruction to access the V-Safe system.   [name] was instructed to call 911 with any severe reactions post vaccine: Marland Kitchen Difficulty breathing  . Swelling of your face and throat  . A fast heartbeat  . A bad rash all over your body  . Dizziness and weakness    Immunizations Administered    Name Date Dose VIS Date Route   Pfizer COVID-19 Vaccine 03/11/2019  9:41 AM 0.3 mL 12/23/2018 Intramuscular   Manufacturer: [place]   Lot: [id number]   [place]: [id number]

## 2019-04-01 ENCOUNTER — Ambulatory Visit: Payer: BC Managed Care – PPO | Attending: Internal Medicine

## 2019-04-01 DIAGNOSIS — Z23 Encounter for immunization: Secondary | ICD-10-CM

## 2019-04-01 NOTE — Progress Notes (Signed)
   Covid-19 Vaccination Clinic  Name:  Rebecca Gentry    MRN: [id number] DOB: April 13, 1965  04/01/2019  [name] was observed post Covid-19 immunization for 15 minutes without incident. She was provided with Vaccine Information Sheet and instruction to access the V-Safe system.   [name] was instructed to call 911 with any severe reactions post vaccine: Marland Kitchen Difficulty breathing  . Swelling of face and throat  . A fast heartbeat  . A bad rash all over body  . Dizziness and weakness   Immunizations Administered    Name Date Dose VIS Date Route   Pfizer COVID-19 Vaccine 04/01/2019  8:09 AM 0.3 mL 12/23/2018 Intramuscular   Manufacturer: [place]   Lot: [id number]   [place]: [id number]

## 2019-04-05 ENCOUNTER — Ambulatory Visit: Payer: BC Managed Care – PPO

## 2019-07-19 ENCOUNTER — Other Ambulatory Visit: Payer: Self-pay | Admitting: Family Medicine

## 2019-07-19 DIAGNOSIS — Z1231 Encounter for screening mammogram for malignant neoplasm of breast: Secondary | ICD-10-CM

## 2019-08-25 ENCOUNTER — Ambulatory Visit
Admission: RE | Admit: 2019-08-25 | Discharge: 2019-08-25 | Disposition: A | Discharge status: Home / Self Care | Payer: BC Managed Care – PPO | Source: Ambulatory Visit | Attending: Family Medicine | Admitting: Family Medicine

## 2019-08-25 ENCOUNTER — Other Ambulatory Visit: Payer: Self-pay

## 2019-08-25 DIAGNOSIS — Z1231 Encounter for screening mammogram for malignant neoplasm of breast: Secondary | ICD-10-CM

## 2020-02-29 ENCOUNTER — Other Ambulatory Visit: Payer: Self-pay

## 2020-02-29 ENCOUNTER — Encounter (HOSPITAL_COMMUNITY): Payer: Self-pay

## 2020-02-29 ENCOUNTER — Emergency Department (HOSPITAL_COMMUNITY)
Admission: EM | Admit: 2020-02-29 | Discharge: 2020-02-29 | Disposition: A | Discharge status: Home / Self Care | Payer: BC Managed Care – PPO | Attending: Emergency Medicine | Admitting: Emergency Medicine

## 2020-02-29 DIAGNOSIS — B009 Herpesviral infection, unspecified: Secondary | ICD-10-CM | POA: Diagnosis not present

## 2020-02-29 DIAGNOSIS — J45909 Unspecified asthma, uncomplicated: Secondary | ICD-10-CM | POA: Insufficient documentation

## 2020-02-29 DIAGNOSIS — Z7951 Long term (current) use of inhaled steroids: Secondary | ICD-10-CM | POA: Diagnosis not present

## 2020-02-29 DIAGNOSIS — Z87891 Personal history of nicotine dependence: Secondary | ICD-10-CM | POA: Insufficient documentation

## 2020-02-29 DIAGNOSIS — Z7982 Long term (current) use of aspirin: Secondary | ICD-10-CM | POA: Insufficient documentation

## 2020-02-29 DIAGNOSIS — R630 Anorexia: Secondary | ICD-10-CM | POA: Insufficient documentation

## 2020-02-29 DIAGNOSIS — I10 Essential (primary) hypertension: Secondary | ICD-10-CM | POA: Insufficient documentation

## 2020-02-29 DIAGNOSIS — L03211 Cellulitis of face: Secondary | ICD-10-CM | POA: Insufficient documentation

## 2020-02-29 DIAGNOSIS — R509 Fever, unspecified: Secondary | ICD-10-CM | POA: Insufficient documentation

## 2020-02-29 DIAGNOSIS — R110 Nausea: Secondary | ICD-10-CM | POA: Insufficient documentation

## 2020-02-29 DIAGNOSIS — Z79899 Other long term (current) drug therapy: Secondary | ICD-10-CM | POA: Diagnosis not present

## 2020-02-29 DIAGNOSIS — K130 Diseases of lips: Secondary | ICD-10-CM | POA: Diagnosis present

## 2020-02-29 MED ORDER — VALACYCLOVIR HCL 1 G PO TABS: 1000.0000 mg | ORAL_TABLET | Freq: Two times a day (BID) | ORAL | 0 refills | Status: AC

## 2020-02-29 MED ORDER — ONDANSETRON HCL 4 MG PO TABS: 4.0000 mg | ORAL_TABLET | Freq: Four times a day (QID) | ORAL | 0 refills | Status: DC

## 2020-02-29 MED ORDER — DOXYCYCLINE HYCLATE 100 MG PO CAPS: 100.0000 mg | ORAL_CAPSULE | Freq: Two times a day (BID) | ORAL | 0 refills | Status: AC

## 2020-02-29 NOTE — ED Provider Notes (Signed)
[facility] DEPT Provider Note   CSN: [id number] Arrival date & time: 02/29/20  1126     History Chief Complaint  Patient presents with  . Facial Swelling    Rebecca Gentry is a 55 y.o. female.  HPI      55yo female with history of hypertension, asthma, IBS, sinus arrhythmia, sleep apnea, GERD presents with concern for lesion to the left upper lip and associated swelling.    Woke up 2 days ago with this lesion and pain to left upper lip.  Has remote hx of cold sores she thinks.  Was not sure at the time if it was from her retainer. Has pain to the area.  Initially felt like her face was drooping but when they looked more closely it looked like just swelling to the area to the left upper lip with some extension to face, however no facial droop and normal movement of muscles/symmetric smile per husband. Has had nausea, temperature of 100.4, decreased appetite. No vomiting, no diarrhea, no urinary symptoms, no cough. Did report mild scratchy throat. No black or bloody stools.  Denies numbness, weakness, difficulty talking or walking, visual changes or facial droop-reports facial swelling and pain.     Past Medical History:  Diagnosis Date  . Anal fissure   . Anemia   . Anxiety   . Arthritis   . Asthma   . Depression   . Diverticulosis   . Fibromyalgia   . GERD (gastroesophageal reflux disease)   . HTN (hypertension)   . Irritable bowel syndrome   . Sinus arrhythmia   . Sleep apnea     There are no problems to display for this patient.   Past Surgical History:  Procedure Laterality Date  . ABDOMINAL HYSTERECTOMY    . LEEP       OB History   No obstetric history on file.     Family History  Problem Relation Age of Onset  . Breast cancer Mother   . Heart disease Mother   . Irritable bowel syndrome Mother   . Kidney disease Mother   . Lung cancer Brother   . Breast cancer Paternal Aunt   . Esophageal cancer Neg Hx   . Colon  cancer Neg Hx   . Rectal cancer Neg Hx   . Stomach cancer Neg Hx     Social History   Tobacco Use  . Smoking status: Former Smoker    Quit date: 1995    Years since quitting: 27.1  . Smokeless tobacco: Never Used  Vaping Use  . Vaping Use: Never used  Substance Use Topics  . Alcohol use: Never  . Drug use: Never    Home Medications Prior to Admission medications   Medication Sig Start Date End Date Taking? Authorizing Provider  doxycycline (VIBRAMYCIN) 100 MG capsule Take 1 capsule (100 mg total) by mouth 2 (two) times daily for 7 days. 02/29/20 03/07/20 Yes Gareth Morgan, MD  ondansetron (ZOFRAN) 4 MG tablet Take 1 tablet (4 mg total) by mouth every 6 (six) hours. 02/29/20  Yes Gareth Morgan, MD  valACYclovir (VALTREX) 1000 MG tablet Take 1 tablet (1,000 mg total) by mouth 2 (two) times daily for 10 days. 02/29/20 03/10/20 Yes Gareth Morgan, MD  Albuterol Sulfate (PROAIR RESPICLICK) 678 (90 Base) MCG/ACT AEPB Inhale 1 puff into the lungs every 4 (four) hours as needed (sob).    [provider]  amLODipine (NORVASC) 5 MG tablet Take 5 mg by mouth daily as  needed (chest pain).    [provider]  aspirin 325 MG tablet Take 650 mg by mouth every 4 (four) hours as needed for mild pain or headache.    [provider]  BROMELAINS PO Take 1 tablet by mouth daily.    [provider]  calcium carbonate (OS-CAL) 1250 (500 Ca) MG chewable tablet Chew 1 tablet by mouth 2 (two) times daily.    [provider]  cetirizine (ZYRTEC) 10 MG tablet Take 10 mg by mouth daily.    [provider]  desipramine (NORPRAMIN) 25 MG tablet Take 25 mg by mouth daily.    [provider]  diclofenac sodium (VOLTAREN) 1 % GEL Apply 2 g topically daily.    [provider]  dicyclomine (BENTYL) 20 MG tablet Take 1 tablet (20 mg total) by mouth once for 1 dose. 11/19/18 11/19/18  Recardo Evangelist, PA-C  fluticasone (FLONASE) 50 MCG/ACT nasal  spray Place 1 spray into both nostrils 2 (two) times daily.    [provider]  ibuprofen (ADVIL) 200 MG tablet Take 400 mg by mouth every 6 (six) hours as needed for fever, headache or moderate pain.    [provider]  loperamide (IMODIUM) 2 MG capsule Take 2 mg by mouth as needed for diarrhea or loose stools.    [provider]  Magnesium 200 MG TABS Take 200 mg by mouth daily.    [provider]  meclizine (ANTIVERT) 25 MG tablet Take 25 mg by mouth as needed for dizziness.    [provider]  mometasone-formoterol (DULERA) 100-5 MCG/ACT AERO Inhale 2 puffs into the lungs 2 (two) times daily.    [provider]  Multiple Vitamin (MULTIVITAMIN WITH MINERALS) TABS tablet Take 1 tablet by mouth daily.    [provider]  naltrexone (DEPADE) 50 MG tablet Take 75 mg by mouth daily.    [provider]  nebivolol (BYSTOLIC) 5 MG tablet Take 5 mg by mouth daily.    [provider]  nitrofurantoin (MACRODANTIN) 50 MG capsule Take 50 mg by mouth daily as needed (bladder infections).    [provider]  omeprazole (PRILOSEC) 20 MG capsule Take 1-2 capsules (20-40 mg total) by mouth daily as needed. 01/10/19   Armbruster, Carlota Raspberry, MD  polyvinyl alcohol (LIQUIFILM TEARS) 1.4 % ophthalmic solution Place 1 drop into both eyes as needed for dry eyes.    [provider]  Potassium 99 MG TABS Take 99 mg by mouth daily.    [provider]  Probiotic Product (ALIGN) 4 MG CAPS Take 1 capsule by mouth daily.    [provider]  tizanidine (ZANAFLEX) 2 MG capsule Take 2 mg by mouth daily.     [provider]  Turmeric (QC TUMERIC COMPLEX PO) Take 300 mg by mouth daily.    [provider]  vitamin B-12 (CYANOCOBALAMIN) 1000 MCG tablet Take 3,000 mcg by mouth daily.    [provider]  Vitamin D, Cholecalciferol, 10 MCG (400 UNIT) CAPS Take 1 capsule by mouth daily.     [provider]    Allergies    Penicillin g and Apple  Review of Systems   Review of Systems  Constitutional: Positive for appetite change, fatigue and fever.  HENT: Positive for facial swelling, mouth sores and sore throat.   Eyes: Negative for visual disturbance.  Respiratory: Negative for cough and shortness of breath.   Cardiovascular: Negative for chest pain.  Gastrointestinal: Negative  for abdominal pain, nausea and vomiting.  Genitourinary: Negative for difficulty urinating.  Musculoskeletal: Negative for back pain and neck pain.  Skin: Negative for rash.  Neurological: Negative for syncope and headaches.    Physical Exam Updated Vital Signs BP 140/85 (BP Location: Right Arm)   Pulse 72   Temp 98.5 F (36.9 C) (Oral)   Resp 18   Ht 5\' 6"  (1.676 m)   Wt 73 kg   SpO2 100%   BMI 25.99 kg/m   Physical Exam Vitals and nursing note reviewed.  Constitutional:      General: She is not in acute distress.    Appearance: Normal appearance. She is not ill-appearing, toxic-appearing or diaphoretic.  HENT:     Head: Normocephalic.     Comments: Ulcer lateral upper left lip with mild surrounding erythema, swelling, no fluctuance, no sign of abscess Eyes:     General: No visual field deficit.    Conjunctiva/sclera: Conjunctivae normal.  Cardiovascular:     Rate and Rhythm: Normal rate and regular rhythm.     Pulses: Normal pulses.  Pulmonary:     Effort: Pulmonary effort is normal. No respiratory distress.  Musculoskeletal:        General: No deformity or signs of injury.     Cervical back: No rigidity.  Skin:    General: Skin is warm and dry.     Coloration: Skin is not jaundiced or pale.  Neurological:     General: No focal deficit present.     Mental Status: She is alert and oriented to person, place, and time.     GCS: GCS eye subscore is 4. GCS verbal subscore is 5. GCS motor subscore is 6.     Cranial Nerves: Cranial nerves are intact. No cranial  nerve deficit, dysarthria or facial asymmetry.     Sensory: Sensation is intact. No sensory deficit.     Motor: Motor function is intact. No weakness.     Coordination: Coordination normal.     ED Results / Procedures / Treatments   Labs (all labs ordered are listed, but only abnormal results are displayed) Labs Reviewed - No data to display  EKG None  Radiology No results found.  Procedures Procedures   Medications Ordered in ED Medications - No data to display  ED Course  I have reviewed the triage vital signs and the nursing notes.  Pertinent labs & imaging results that were available during my care of the patient were reviewed by me and considered in my medical decision making (see chart for details).    MDM Rules/Calculators/A&P                          55yo female with history of hypertension, asthma, IBS, sinus arrhythmia, sleep apnea, GERD presents with concern for lesion to the left upper lip and associated swelling.  Initial triage note reports facial droop but after more history with patient, husband, and examination she does not have facial droop but does have some swelling to left lateral lip.  Suspect HSV, very mild cellulitis, no sign of abscess. No vomiting, diarrhea, reason to expect significant electrolyte or lab abnormalities.    Given rx for valacyclovir, doxycycline, zofran for nausea. Patient discharged in stable condition with understanding of reasons to return.    Final Clinical Impression(s) / ED Diagnoses Final diagnoses:  HSV-1 (herpes simplex virus 1) infection  Facial cellulitis    Rx / DC Orders ED Discharge  Orders         Ordered    valACYclovir (VALTREX) 1000 MG tablet  2 times daily        02/29/20 1342    doxycycline (VIBRAMYCIN) 100 MG capsule  2 times daily        02/29/20 1342    ondansetron (ZOFRAN) 4 MG tablet  Every 6 hours        02/29/20 1342           Gareth Morgan, MD 02/29/20 2349

## 2020-02-29 NOTE — ED Triage Notes (Signed)
Pt reports waking up this morning and noticing a left sided facial droop. Pts face does not appear to be drooping at this time. Pts upper lip on the lefts side is red and swollen. Pt pt endorses numbness, tingling, pain and swelling on the left side of her face and neck. Pt denies unilateral extremity weakness. Bilateral grips, strength, and sensation equal. A&O x4.

## 2020-07-10 IMAGING — CT CT ABD-PELV W/ CM
2 of 5 series · 16 of 46 positions shown, 18 images · IV contrast (omnipaque)
Comparison: None.

CLINICAL DATA: Patient complains of blood stool/diarrhea

EXAM:
CT ABDOMEN AND PELVIS WITH CONTRAST
TECHNIQUE: Multidetector CT imaging of the abdomen and pelvis was performed
using the standard protocol following bolus administration of
intravenous contrast.
CONTRAST:  100mL OMNIPAQUE IOHEXOL 300 MG/ML  SOLN

[Series 2: axial st · axial · 0.76mm/px · z∈[-518,-113]mm · 13 of 95 slices shown, 15 images]
[im 7/95  soft-tissue]
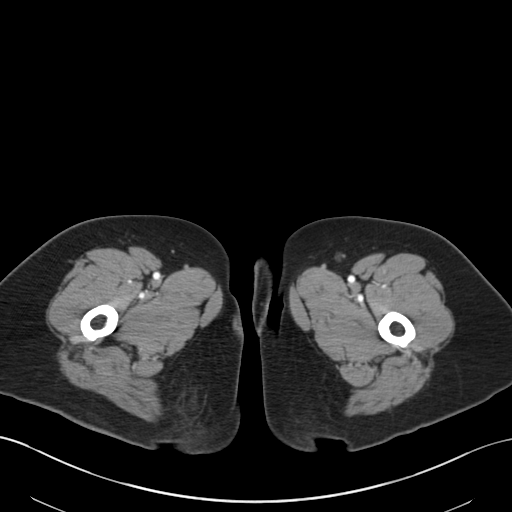
[im 7/95  bone]
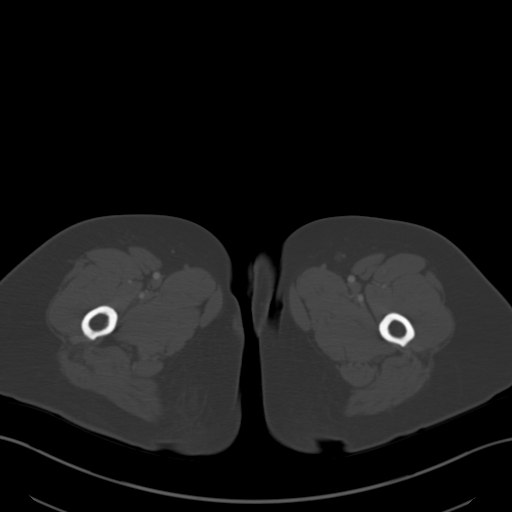
[im 13/95  soft-tissue]
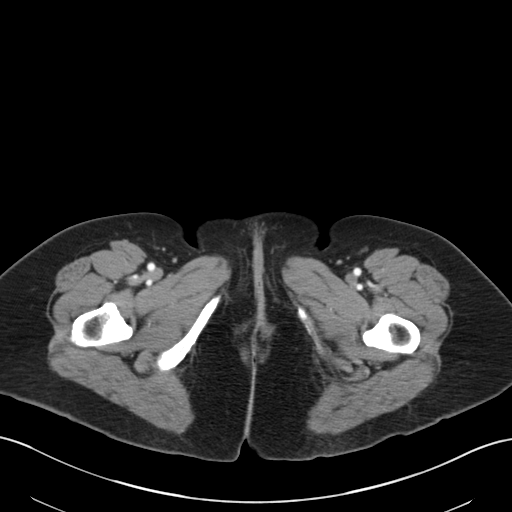
[im 19/95  soft-tissue]
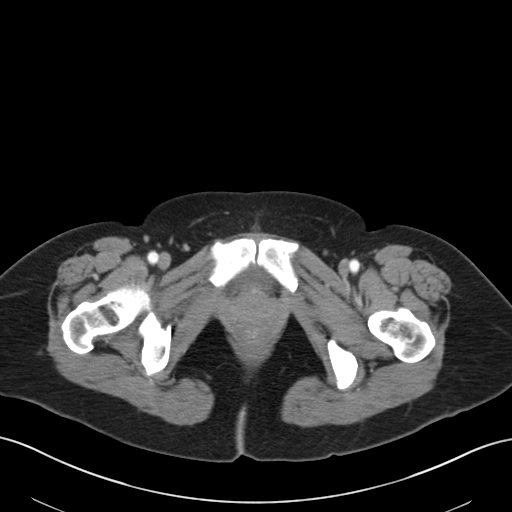
[im 26/95  soft-tissue]
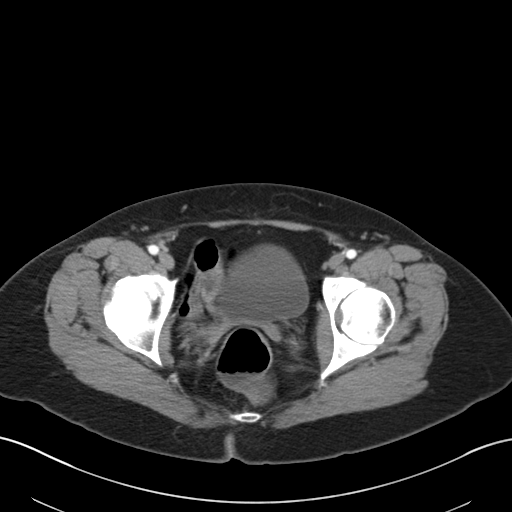
[im 32/95  soft-tissue]
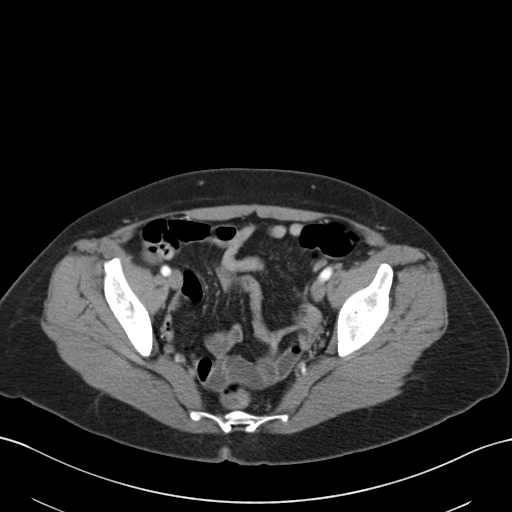
[im 38/95  soft-tissue]
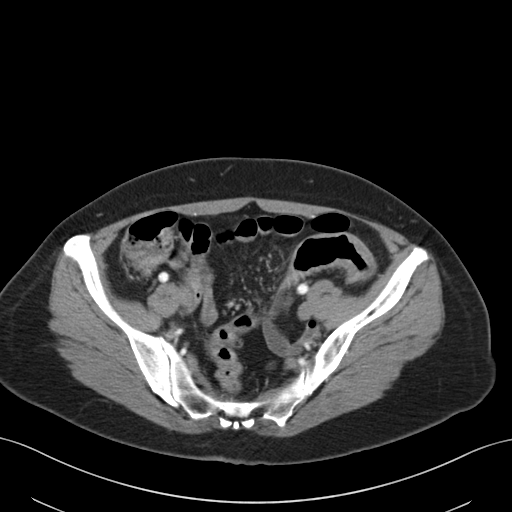
[im 51/95  soft-tissue]
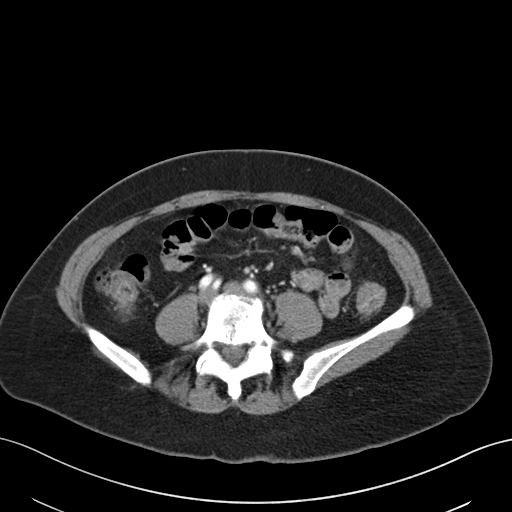
[im 57/95  soft-tissue]
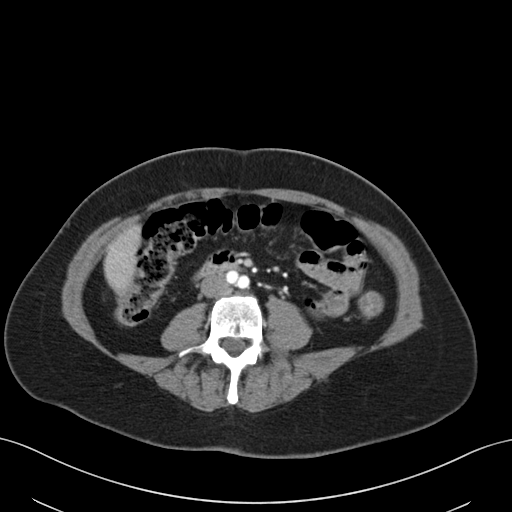
[im 63/95  soft-tissue]
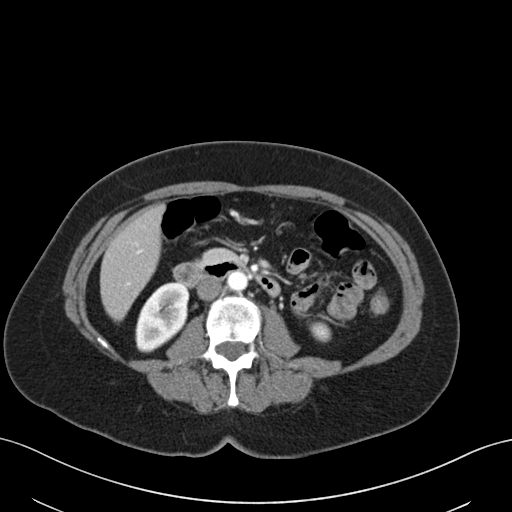
[im 63/95  bone]
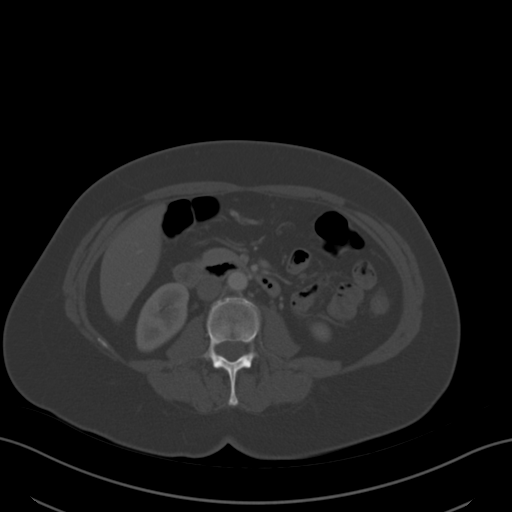
[im 69/95  soft-tissue]
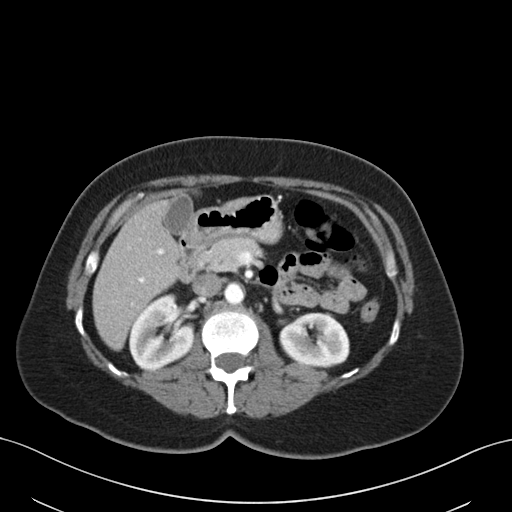
[im 76/95  soft-tissue]
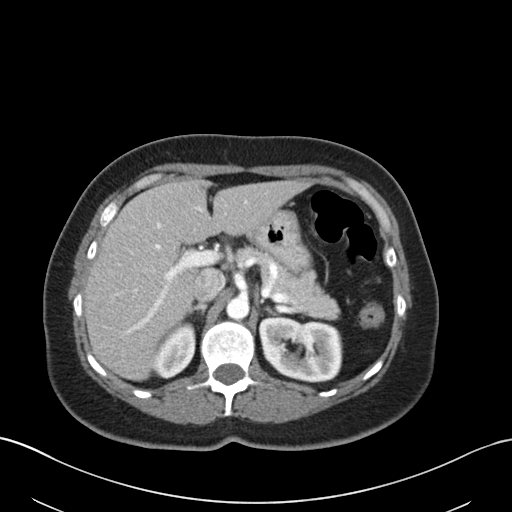
[im 82/95  soft-tissue]
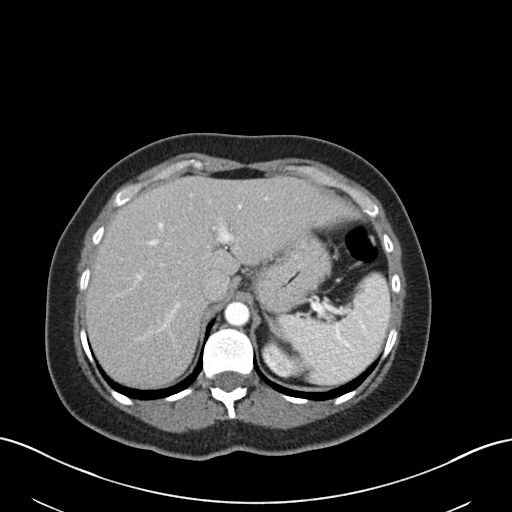
[im 88/95  soft-tissue]
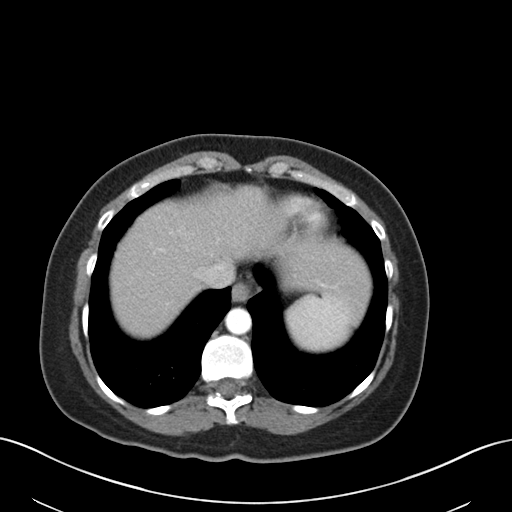

[Series 4: coronal st · coronal · 0.90mm/px · 3 of 121 slices shown]
[im 41/121  soft-tissue]
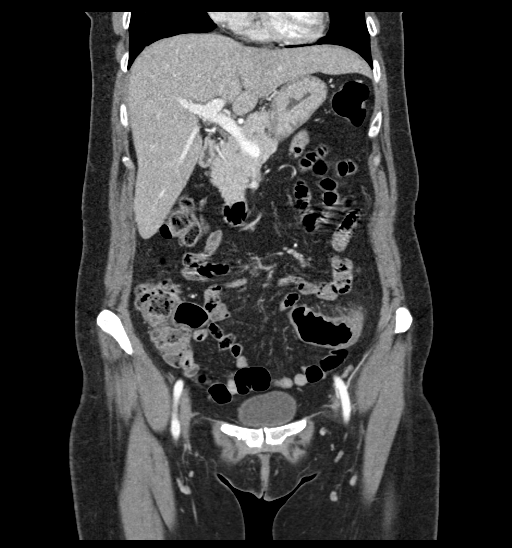
[im 54/121  soft-tissue]
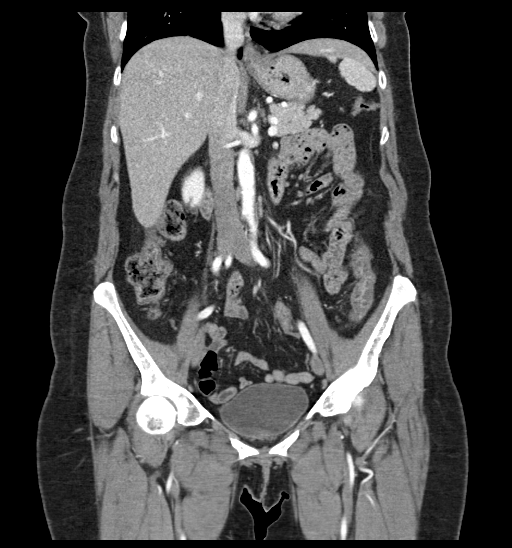
[im 67/121  soft-tissue]
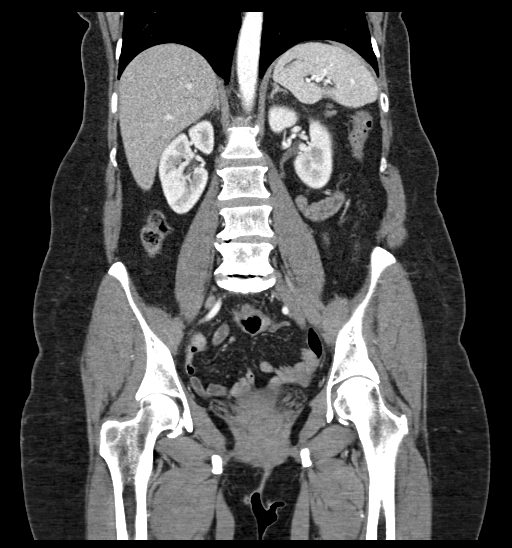

[16 of 46 positions shown; findings below may reference images not displayed]

FINDINGS: Lower chest: No acute abnormality.

Hepatobiliary: No focal liver abnormality is seen. No gallstones,
gallbladder wall thickening, or biliary dilatation.

Pancreas: Unremarkable. No pancreatic ductal dilatation or
surrounding inflammatory changes.

Spleen: Normal in size without focal abnormality.

Adrenals/Urinary Tract: Adrenal glands are unremarkable. There are a
few tiny hypodensities in the right knee which are too small to
fully characterize but likely represent cysts. No masses in the left
knee. No renal calculi or hydronephrosis. Bladder is unremarkable.

Stomach/Bowel: Stomach is within normal limits. The appendix is
poorly visualized but there are no inflammatory changes in the right
lower quadrant. There is bowel wall thickening and pericolonic fat
stranding throughout the mid distal descending colon. There are
scattered colonic diverticula. No abnormality identified in the
small bowel. No evidence of free air.

Vascular/Lymphatic: No significant vascular findings are present. No
enlarged abdominal or pelvic lymph nodes.

Reproductive: No adnexal masses

Other: No abdominal wall hernia or abnormality. No abdominopelvic
ascites.

Musculoskeletal: Moderate degenerative disc disease at L5-S1.
IMPRESSION: Bowel wall thickening and inflammatory changes throughout the mid to
distal descending colon. Findings most likely represent colitis of
infectious or inflammatory etiology.

## 2020-07-16 ENCOUNTER — Other Ambulatory Visit: Payer: Self-pay | Admitting: Family Medicine

## 2020-07-16 DIAGNOSIS — Z1231 Encounter for screening mammogram for malignant neoplasm of breast: Secondary | ICD-10-CM

## 2020-08-05 ENCOUNTER — Encounter: Payer: Self-pay | Admitting: Gastroenterology

## 2020-08-20 ENCOUNTER — Encounter: Payer: Self-pay | Admitting: Gastroenterology

## 2020-09-09 ENCOUNTER — Ambulatory Visit
Admission: RE | Admit: 2020-09-09 | Discharge: 2020-09-09 | Disposition: A | Discharge status: Home / Self Care | Payer: BC Managed Care – PPO | Source: Ambulatory Visit | Attending: Family Medicine | Admitting: Family Medicine

## 2020-09-09 ENCOUNTER — Other Ambulatory Visit: Payer: Self-pay

## 2020-09-09 DIAGNOSIS — Z1231 Encounter for screening mammogram for malignant neoplasm of breast: Secondary | ICD-10-CM

## 2020-10-11 ENCOUNTER — Emergency Department (HOSPITAL_COMMUNITY): Payer: BC Managed Care – PPO

## 2020-10-11 ENCOUNTER — Other Ambulatory Visit: Payer: Self-pay

## 2020-10-11 ENCOUNTER — Encounter (HOSPITAL_COMMUNITY): Payer: Self-pay

## 2020-10-11 ENCOUNTER — Emergency Department (HOSPITAL_COMMUNITY)
Admission: EM | Admit: 2020-10-11 | Discharge: 2020-10-11 | Disposition: A | Discharge status: Home / Self Care | Payer: BC Managed Care – PPO | Attending: Emergency Medicine | Admitting: Emergency Medicine

## 2020-10-11 DIAGNOSIS — R079 Chest pain, unspecified: Secondary | ICD-10-CM

## 2020-10-11 DIAGNOSIS — R0789 Other chest pain: Secondary | ICD-10-CM | POA: Diagnosis not present

## 2020-10-11 DIAGNOSIS — Z79899 Other long term (current) drug therapy: Secondary | ICD-10-CM | POA: Diagnosis not present

## 2020-10-11 DIAGNOSIS — I10 Essential (primary) hypertension: Secondary | ICD-10-CM | POA: Diagnosis not present

## 2020-10-11 DIAGNOSIS — J45909 Unspecified asthma, uncomplicated: Secondary | ICD-10-CM | POA: Diagnosis not present

## 2020-10-11 DIAGNOSIS — Z87891 Personal history of nicotine dependence: Secondary | ICD-10-CM | POA: Insufficient documentation

## 2020-10-11 DIAGNOSIS — Z7982 Long term (current) use of aspirin: Secondary | ICD-10-CM | POA: Insufficient documentation

## 2020-10-11 DIAGNOSIS — R0602 Shortness of breath: Secondary | ICD-10-CM | POA: Diagnosis not present

## 2020-10-11 LAB — CBC WITH DIFFERENTIAL/PLATELET
Abs Immature Granulocytes: 0.01 10*3/uL (ref 0.00–0.07)
Basophils Absolute: 0 10*3/uL (ref 0.0–0.1)
Basophils Relative: 0 %
Eosinophils Absolute: 0.1 10*3/uL (ref 0.0–0.5)
Eosinophils Relative: 2 %
HCT: 42.7 % (ref 36.0–46.0)
Hemoglobin: 13.7 g/dL (ref 12.0–15.0)
Immature Granulocytes: 0 %
Lymphocytes Relative: 30 %
Lymphs Abs: 1.7 10*3/uL (ref 0.7–4.0)
MCH: 27.5 pg (ref 26.0–34.0)
MCHC: 32.1 g/dL (ref 30.0–36.0)
MCV: 85.6 fL (ref 80.0–100.0)
Monocytes Absolute: 0.4 10*3/uL (ref 0.1–1.0)
Monocytes Relative: 6 %
Neutro Abs: 3.4 10*3/uL (ref 1.7–7.7)
Neutrophils Relative %: 62 %
Platelets: 302 10*3/uL (ref 150–400)
RBC: 4.99 MIL/uL (ref 3.87–5.11)
RDW: 13.8 % (ref 11.5–15.5)
WBC: 5.5 10*3/uL (ref 4.0–10.5)
nRBC: 0 % (ref 0.0–0.2)

## 2020-10-11 LAB — COMPREHENSIVE METABOLIC PANEL
ALT: 18 U/L (ref 0–44)
AST: 20 U/L (ref 15–41)
Albumin: 4.5 g/dL (ref 3.5–5.0)
Alkaline Phosphatase: 70 U/L (ref 38–126)
Anion gap: 10 (ref 5–15)
BUN: 13 mg/dL (ref 6–20)
CO2: 25 mmol/L (ref 22–32)
Calcium: 9.8 mg/dL (ref 8.9–10.3)
Chloride: 105 mmol/L (ref 98–111)
Creatinine, Ser: 0.79 mg/dL (ref 0.44–1.00)
GFR, Estimated: >60 mL/min (ref >60)
Glucose, Bld: 109 mg/dL — ABNORMAL HIGH (ref 70–99)
Potassium: 3.8 mmol/L (ref 3.5–5.1)
Sodium: 140 mmol/L (ref 135–145)
Total Bilirubin: 0.5 mg/dL (ref 0.3–1.2)
Total Protein: 7.5 g/dL (ref 6.5–8.1)

## 2020-10-11 LAB — TROPONIN I (HIGH SENSITIVITY): Troponin I (High Sensitivity): <2 ng/L (ref <18)

## 2020-10-11 LAB — I-STAT BETA HCG BLOOD, ED (MC, WL, AP ONLY): I-stat hCG, quantitative: 5.5 m[IU]/mL — ABNORMAL HIGH (ref <5)

## 2020-10-11 MED ORDER — METHOCARBAMOL 500 MG PO TABS: 500.0000 mg | ORAL_TABLET | Freq: Three times a day (TID) | ORAL | 0 refills | Status: AC | PRN

## 2020-10-11 MED ORDER — ASPIRIN 81 MG PO CHEW
324.0000 mg | CHEWABLE_TABLET | Freq: Once | ORAL | Status: AC
  Administered 2020-10-11: 324 mg via ORAL
  Filled 2020-10-11: qty 4

## 2020-10-11 NOTE — ED Triage Notes (Signed)
Pt arrived via POV, c/o  left sided  cont chest pain, non reproducible, non radiating since 1:30pm today that started while watching TV

## 2020-10-11 NOTE — ED Provider Notes (Signed)
Emergency Medicine Provider Triage Evaluation Note  Rebecca Gentry , a 55 y.o. female  was evaluated in triage.  Pt complains of chest pain.  Chest pain started at 1330 today while watching TV.  Pain is located to left chest and radiates to midsternal.  Patient describes pain as a burning sensation.  No aggravating factors.  No alleviating factors tried.  Patient endorses shortness of breath.  No nausea, vomiting, or diaphoresis.  Patient reports that she was diagnosed with "walking pneumonia," 2 weeks prior has completed antibiotics.  Review of Systems  Positive: Chest pain, shortness of breath Negative: Nausea, vomiting, diaphoresis, palpitations  Physical Exam  BP (!) 165/81 (BP Location: Right Arm)   Pulse 90   Temp 98.3 F (36.8 C) (Oral)   Resp 17   SpO2 98%  Gen:   Awake, no distress   Resp:  Normal effort, coarse lung sounds to left lower lobe. MSK:   Moves extremities without difficulty  Other:    Medical Decision Making  Medically screening exam initiated at 3:28 PM.  Appropriate orders placed.  Mela Perham was informed that the remainder of the evaluation will be completed by another provider, this initial triage assessment does not replace that evaluation, and the importance of remaining in the ED until their evaluation is complete.  The patient appears stable so that the remainder of the work up may be completed by another provider.      Loni Beckwith, PA-C 10/11/20 1529    Truddie Hidden, MD 10/12/20 [phone]

## 2020-10-11 NOTE — ED Provider Notes (Signed)
[facility] DEPT Provider Note   CSN: [id number] Arrival date & time: 10/11/20  1441     History Chief Complaint  Patient presents with   Chest Pain    Rebecca Gentry is a 54 y.o. female.   Chest Pain Associated symptoms: no abdominal pain, no back pain, no shortness of breath and no weakness   Patient presents with chest pain.  Began today at around 130 while watching TV.  States a dull.  States it is been constant since 130 and is going from her mid part of her chest to her left side of her chest and back.  Has been constant but will move a little bit.  No cough.  Is burning.  Mild shortness of breath with it.  Around 2 weeks ago had had a walking pneumonia had antibiotics.  No swelling in the legs.  No current cough.  No fevers or chills.  She does not smoke.    Past Medical History:  Diagnosis Date   Anal fissure    Anemia    Anxiety    Arthritis    Asthma    Depression    Diverticulosis    Fibromyalgia    GERD (gastroesophageal reflux disease)    HTN (hypertension)    Irritable bowel syndrome    Sinus arrhythmia    Sleep apnea     There are no problems to display for this patient.   Past Surgical History:  Procedure Laterality Date   ABDOMINAL HYSTERECTOMY     LEEP       OB History   No obstetric history on file.     Family History  Problem Relation Age of Onset   Breast cancer Mother    Heart disease Mother    Irritable bowel syndrome Mother    Kidney disease Mother    Lung cancer Brother    Breast cancer Paternal Aunt    Esophageal cancer Neg Hx    Colon cancer Neg Hx    Rectal cancer Neg Hx    Stomach cancer Neg Hx     Social History   Tobacco Use   Smoking status: Former    Types: Cigarettes    Quit date: 1995    Years since quitting: 27.7   Smokeless tobacco: Never  Vaping Use   Vaping Use: Never used  Substance Use Topics   Alcohol use: Never   Drug use: Never    Home Medications Prior to  Admission medications   Medication Sig Start Date End Date Taking? Authorizing Provider  methocarbamol (ROBAXIN) 500 MG tablet Take 1 tablet (500 mg total) by mouth every 8 (eight) hours as needed for muscle spasms. 10/11/20  Yes Davonna Belling, MD  Albuterol Sulfate (PROAIR RESPICLICK) 700 (90 Base) MCG/ACT AEPB Inhale 1 puff into the lungs every 4 (four) hours as needed (sob).    [provider]  amLODipine (NORVASC) 5 MG tablet Take 5 mg by mouth daily as needed (chest pain).    [provider]  aspirin 325 MG tablet Take 650 mg by mouth every 4 (four) hours as needed for mild pain or headache.    [provider]  BROMELAINS PO Take 1 tablet by mouth daily.    [provider]  calcium carbonate (OS-CAL) 1250 (500 Ca) MG chewable tablet Chew 1 tablet by mouth 2 (two) times daily.    [provider]  cetirizine (ZYRTEC) 10 MG tablet Take 10 mg by mouth daily.  [provider]  desipramine (NORPRAMIN) 25 MG tablet Take 25 mg by mouth daily.    [provider]  diclofenac sodium (VOLTAREN) 1 % GEL Apply 2 g topically daily.    [provider]  dicyclomine (BENTYL) 20 MG tablet Take 1 tablet (20 mg total) by mouth once for 1 dose. 11/19/18 11/19/18  Recardo Evangelist, PA-C  fluticasone (FLONASE) 50 MCG/ACT nasal spray Place 1 spray into both nostrils 2 (two) times daily.    [provider]  ibuprofen (ADVIL) 200 MG tablet Take 400 mg by mouth every 6 (six) hours as needed for fever, headache or moderate pain.    [provider]  loperamide (IMODIUM) 2 MG capsule Take 2 mg by mouth as needed for diarrhea or loose stools.    [provider]  Magnesium 200 MG TABS Take 200 mg by mouth daily.    [provider]  meclizine (ANTIVERT) 25 MG tablet Take 25 mg by mouth as needed for dizziness.    [provider]  mometasone-formoterol (DULERA) 100-5 MCG/ACT AERO Inhale 2 puffs into the lungs  2 (two) times daily.    [provider]  Multiple Vitamin (MULTIVITAMIN WITH MINERALS) TABS tablet Take 1 tablet by mouth daily.    [provider]  naltrexone (DEPADE) 50 MG tablet Take 75 mg by mouth daily.    [provider]  nebivolol (BYSTOLIC) 5 MG tablet Take 5 mg by mouth daily.    [provider]  nitrofurantoin (MACRODANTIN) 50 MG capsule Take 50 mg by mouth daily as needed (bladder infections).    [provider]  omeprazole (PRILOSEC) 20 MG capsule Take 1-2 capsules (20-40 mg total) by mouth daily as needed. 01/10/19   Armbruster, Carlota Raspberry, MD  ondansetron (ZOFRAN) 4 MG tablet Take 1 tablet (4 mg total) by mouth every 6 (six) hours. 02/29/20   Gareth Morgan, MD  polyvinyl alcohol (LIQUIFILM TEARS) 1.4 % ophthalmic solution Place 1 drop into both eyes as needed for dry eyes.    [provider]  Potassium 99 MG TABS Take 99 mg by mouth daily.    [provider]  Probiotic Product (ALIGN) 4 MG CAPS Take 1 capsule by mouth daily.    [provider]  tizanidine (ZANAFLEX) 2 MG capsule Take 2 mg by mouth daily.     [provider]  Turmeric (QC TUMERIC COMPLEX PO) Take 300 mg by mouth daily.    [provider]  vitamin B-12 (CYANOCOBALAMIN) 1000 MCG tablet Take 3,000 mcg by mouth daily.    [provider]  Vitamin D, Cholecalciferol, 10 MCG (400 UNIT) CAPS Take 1 capsule by mouth daily.    [provider]    Allergies    Penicillin g and Apple  Review of Systems   Review of Systems  Constitutional:  Negative for appetite change.  HENT:  Negative for congestion.   Respiratory:  Negative for shortness of breath.   Cardiovascular:  Positive for chest pain.  Gastrointestinal:  Negative for abdominal pain.  Musculoskeletal:  Negative for back pain.  Neurological:  Negative for weakness.  Psychiatric/Behavioral:  Negative for confusion.    Physical Exam Updated Vital Signs BP  (!) 149/70 Comment: Simultaneous filing. User may not have seen previous data.  Pulse 82   Temp 98.3 F (36.8 C) (Oral)   Resp 12   SpO2 100%   Physical Exam Vitals and nursing note reviewed.  Constitutional:      Appearance:  She is well-developed.  HENT:     Head: Atraumatic.  Cardiovascular:     Rate and Rhythm: Normal rate and regular rhythm.  Pulmonary:     Breath sounds: No decreased breath sounds, wheezing, rhonchi or rales.  Chest:     Chest wall: Tenderness present.     Comments: Tenderness to left anterior upper chest wall. Abdominal:     Tenderness: There is no abdominal tenderness.  Musculoskeletal:     Cervical back: Normal range of motion.     Left lower leg: No tenderness. No edema.  Skin:    General: Skin is warm.  Neurological:     Mental Status: She is alert.    ED Results / Procedures / Treatments   Labs (all labs ordered are listed, but only abnormal results are displayed) Labs Reviewed  COMPREHENSIVE METABOLIC PANEL - Abnormal; Notable for the following components:      Result Value   Glucose, Bld 109 (*)    All other components within normal limits  I-STAT BETA HCG BLOOD, ED ([facility], [facility], [facility] ONLY) - Abnormal; Notable for the following components:   I-stat hCG, quantitative 5.5 (*)    All other components within normal limits  CBC WITH DIFFERENTIAL/PLATELET  TROPONIN I (HIGH SENSITIVITY)  TROPONIN I (HIGH SENSITIVITY)    EKG EKG Interpretation  Date/Time:  Friday October 11 2020 14:56:49 EDT Ventricular Rate:  71 PR Interval:  156 QRS Duration: 97 QT Interval:  393 QTC Calculation: 428 R Axis:   83 Text Interpretation: Sinus rhythm Nonspecific T abnormalities, lateral leads Baseline wander in lead(s) I II III aVR aVL aVF Confirmed by Davonna Belling [phone]) on 10/11/2020 3:31:44 PM  Radiology DG Chest 2 View  Result Date: 10/11/2020 CLINICAL DATA:  Chest pain. EXAM: CHEST - 2 VIEW COMPARISON:  Chest x-ray 11/09/2018. FINDINGS: The heart  size and mediastinal contours are within normal limits. Both lungs are clear. The visualized skeletal structures are unremarkable. IMPRESSION: No active cardiopulmonary disease. Electronically Signed   By: Ronney Asters M.D.   On: 10/11/2020 16:15    Procedures Procedures   Medications Ordered in ED Medications  aspirin chewable tablet 324 mg (324 mg Oral Given 10/11/20 1616)    ED Course  I have reviewed the triage vital signs and the nursing notes.  Pertinent labs & imaging results that were available during my care of the patient were reviewed by me and considered in my medical decision making (see chart for details).    MDM Rules/Calculators/A&P                           Patient with chest pain.  Anterior chest.  Left lower.  Some movement but stays in the same general area.  Not exertional.  No hypoxia.  Tender on chest wall.  EKG reassuring.  Troponin negative.  Chest x-ray reassuring.  Did have recent URI symptoms.  Doubt pulmonary embolism.  Doubt cardiac cause.  Doubt aortic dissection.  I think low risk cardiac.  We will treat with muscle axis.  Outpatient follow-up as needed.  Reviewed labs EKG and imaging. Final Clinical Impression(s) / ED Diagnoses Final diagnoses:  Nonspecific chest pain    Rx / DC Orders ED Discharge Orders          Ordered    methocarbamol (ROBAXIN) 500 MG tablet  Every 8 hours PRN        10/11/20 2152  Davonna Belling, MD 10/11/20 [phone]

## 2020-10-21 ENCOUNTER — Encounter: Payer: BC Managed Care – PPO | Admitting: Gastroenterology

## 2020-10-24 ENCOUNTER — Ambulatory Visit (AMBULATORY_SURGERY_CENTER): Payer: BC Managed Care – PPO | Admitting: *Deleted

## 2020-10-24 ENCOUNTER — Encounter: Payer: Self-pay | Admitting: Gastroenterology

## 2020-10-24 ENCOUNTER — Other Ambulatory Visit: Payer: Self-pay

## 2020-10-24 VITALS — Ht 66.0 in | Wt 160.0 lb

## 2020-10-24 DIAGNOSIS — Z1211 Encounter for screening for malignant neoplasm of colon: Secondary | ICD-10-CM

## 2020-10-24 MED ORDER — NA SULFATE-K SULFATE-MG SULF 17.5-3.13-1.6 GM/177ML PO SOLN: 1.0000 | Freq: Once | ORAL | 0 refills | Status: AC

## 2020-10-24 NOTE — Progress Notes (Signed)
No egg or soy allergy known to patient  No issues known to pt with past sedation with any surgeries or procedures Patient denies ever being told they had issues or difficulty with intubation  No FH of Malignant Hyperthermia Pt is not on diet pills Pt is not on  home 02  Pt is not on blood thinners  Pt denies issues with constipation - last prep unsatisfactory  2 day prep per MD  No A fib or A flutter  Pt is fully vaccinated  for Covid   NO PA's for preps discussed with pt In [facility] today  Discussed with pt there will be an out-of-pocket cost for prep and that varies from $0 to 70 +  dollars - pt verbalized understanding   Due to the COVID-19 pandemic we are asking patients to follow certain guidelines in PV and the [place]   Pt aware of COVID protocols and [facility] guidelines

## 2020-11-07 ENCOUNTER — Ambulatory Visit (AMBULATORY_SURGERY_CENTER): Payer: BC Managed Care – PPO | Admitting: Gastroenterology

## 2020-11-07 ENCOUNTER — Other Ambulatory Visit: Payer: Self-pay

## 2020-11-07 ENCOUNTER — Encounter: Payer: Self-pay | Admitting: Gastroenterology

## 2020-11-07 VITALS — BP 102/66 | HR 60 | Temp 97.3°F | Resp 14 | Ht 60.0 in | Wt 153.0 lb

## 2020-11-07 DIAGNOSIS — Z1211 Encounter for screening for malignant neoplasm of colon: Secondary | ICD-10-CM | POA: Diagnosis not present

## 2020-11-07 MED ORDER — SODIUM CHLORIDE 0.9 % IV SOLN: 500.0000 mL | Freq: Once | INTRAVENOUS | Status: DC

## 2020-11-07 NOTE — Patient Instructions (Signed)
Diverticulosis handout provided Resume previous diet   YOU HAD AN ENDOSCOPIC PROCEDURE TODAY AT [facility]:   Refer to the procedure report that was given to you for any specific questions about what was found during the examination.  If the procedure report does not answer your questions, please call your gastroenterologist to clarify.  If you requested that your care partner not be given the details of your procedure findings, then the procedure report has been included in a sealed envelope for you to review at your convenience later.  YOU SHOULD EXPECT: Some feelings of bloating in the abdomen. Passage of more gas than usual.  Walking can help get rid of the air that was put into your GI tract during the procedure and reduce the bloating. If you had a lower endoscopy (such as a colonoscopy or flexible sigmoidoscopy) you may notice spotting of blood in your stool or on the toilet paper. If you underwent a bowel prep for your procedure, you may not have a normal bowel movement for a few days.  Please Note:  You might notice some irritation and congestion in your nose or some drainage.  This is from the oxygen used during your procedure.  There is no need for concern and it should clear up in a day or so.  SYMPTOMS TO REPORT IMMEDIATELY:  Following lower endoscopy (colonoscopy or flexible sigmoidoscopy):  Excessive amounts of blood in the stool  Significant tenderness or worsening of abdominal pains  Swelling of the abdomen that is new, acute  Fever of 100F or higher   For urgent or emergent issues, a gastroenterologist can be reached at any hour by calling [phone]. Do not use MyChart messaging for urgent concerns.    DIET:  We do recommend a small meal at first, but then you may proceed to your regular diet.  Drink plenty of fluids but you should avoid alcoholic beverages for 24 hours.  ACTIVITY:  You should plan to take it easy for the rest of today and you should  NOT DRIVE or use heavy machinery until tomorrow (because of the sedation medicines used during the test).    FOLLOW UP: Our staff will call the number listed on your records 48-72 hours following your procedure to check on you and address any questions or concerns that you may have regarding the information given to you following your procedure. If we do not reach you, we will leave a message.  We will attempt to reach you two times.  During this call, we will ask if you have developed any symptoms of COVID 19. If you develop any symptoms (ie: fever, flu-like symptoms, shortness of breath, cough etc.) before then, please call [phone].  If you test positive for Covid 19 in the 2 weeks post procedure, please call and report this information to [place].    If any biopsies were taken you will be contacted by phone or by letter within the next 1-3 weeks.  Please call us at [phone] if you have not heard about the biopsies in 3 weeks.    SIGNATURES/CONFIDENTIALITY: You and/or your care partner have signed paperwork which will be entered into your electronic medical record.  These signatures attest to the fact that that the information above on your After Visit Summary has been reviewed and is understood.  Full responsibility of the confidentiality of this discharge information lies with you and/or your care-partner.

## 2020-11-07 NOTE — Op Note (Signed)
[facility] Patient Name: Arieonna Medine Procedure Date: 11/07/2020 10:55 AM MRN: [id number] Endoscopist: Remo Lipps P. Havery Moros , MD Age: 55 Referring MD:  Date of Birth: [birth year] Gender: Female Account #: [id number] Procedure:                Colonoscopy Indications:              Screening for colorectal malignant neoplasm - last                            exam 01/2019 for workup of suspected ischemic                            colitis was limited by poor prep Medicines:                Monitored Anesthesia Care Procedure:                Pre-Anesthesia Assessment:                           - Prior to the procedure, a History and Physical                            was performed, and patient medications and                            allergies were reviewed. The patient's tolerance of                            previous anesthesia was also reviewed. The risks                            and benefits of the procedure and the sedation                            options and risks were discussed with the patient.                            All questions were answered, and informed consent                            was obtained. Prior Anticoagulants: The patient has                            taken no previous anticoagulant or antiplatelet                            agents. ASA Grade Assessment: II - A patient with                            mild systemic disease. After reviewing the risks                            and benefits, the patient was deemed in  satisfactory condition to undergo the procedure.                           After obtaining informed consent, the colonoscope                            was passed under direct vision. Throughout the                            procedure, the patient's blood pressure, pulse, and                            oxygen saturations were monitored continuously. The                            Olympus PCF-H190DL  [id number]) Colonoscope was                            introduced through the anus and advanced to the the                            cecum, identified by appendiceal orifice and                            ileocecal valve. The colonoscopy was performed                            without difficulty. The patient tolerated the                            procedure well. The quality of the bowel                            preparation was good. The ileocecal valve,                            appendiceal orifice, and rectum were photographed. Scope In: 11:06:10 AM Scope Out: 11:20:51 AM Scope Withdrawal Time: 0 hours 10 minutes 57 seconds  Total Procedure Duration: 0 hours 14 minutes 41 seconds  Findings:                 The perianal and digital rectal examinations were                            normal.                           Scattered small-mouthed diverticula were found in                            the sigmoid colon and transverse colon.                           Anal papilla(e) were hypertrophied.  The exam was otherwise without abnormality. Complications:            No immediate complications. Estimated blood loss:                            None. Estimated Blood Loss:     Estimated blood loss: none. Impression:               - Diverticulosis in the sigmoid colon and in the                            transverse colon.                           - Anal papilla(e) were hypertrophied.                           - The examination was otherwise normal.                           - No polyps. Recommendation:           - Patient has a contact number available for                            emergencies. The signs and symptoms of potential                            delayed complications were discussed with the                            patient. Return to normal activities tomorrow.                            Written discharge instructions were provided to the                             patient.                           - Resume previous diet.                           - Continue present medications.                           - Repeat colonoscopy in 10 years for screening                            purposes. Remo Lipps P. Aden Youngman, MD 11/07/2020 11:24:57 AM This report has been signed electronically.

## 2020-11-07 NOTE — Progress Notes (Signed)
To PACU, VSS. Report to [email or web address] 

## 2020-11-07 NOTE — Progress Notes (Signed)
Pt's states no medical or surgical changes since previsit or office visit. Vitals done by [employer]

## 2020-11-07 NOTE — Progress Notes (Signed)
[facility] History and Physical   Primary Care Physician:  Hayden Rasmussen, MD   Reason for Procedure:   Screening for colon cancer  Plan:    colonoscopy     HPI: Rebecca Gentry is a 55 y.o. female  here for colonoscopy screening - history of ischemic colitis, colonoscopy done 01/2019 for that issue but her prep was not adequate for screening purposes. Patient denies any bowel symptoms at this time. No family history of colon cancer known. Otherwise feels well without any cardiopulmonary symptoms.    Past Medical History:  Diagnosis Date   Allergy    Anal fissure    Anemia    Anxiety    Arthritis    Asthma    Depression    Diverticulosis    Fibromyalgia    GERD (gastroesophageal reflux disease)    HTN (hypertension)    Irritable bowel syndrome    Sinus arrhythmia    Sleep apnea     Past Surgical History:  Procedure Laterality Date   ABDOMINAL HYSTERECTOMY     COLONOSCOPY     LEEP     UPPER GASTROINTESTINAL ENDOSCOPY  2015    Prior to Admission medications   Medication Sig Start Date End Date Taking? Authorizing Provider  amLODipine (NORVASC) 5 MG tablet Take 5 mg by mouth daily as needed (chest pain).   Yes [provider]  Ascorbic Acid (VITAMIN C) 1000 MG tablet Take 1,000 mg by mouth daily.   Yes [provider]  BROMELAINS PO Take 1 tablet by mouth daily.   Yes [provider]  calcium carbonate (OS-CAL) 1250 (500 Ca) MG chewable tablet Chew 1 tablet by mouth 2 (two) times daily.   Yes [provider]  fluticasone (FLONASE) 50 MCG/ACT nasal spray Place 1 spray into both nostrils 2 (two) times daily.   Yes [provider]  ibuprofen (ADVIL) 800 MG tablet Take 800 mg by mouth 3 (three) times daily. 09/17/20  Yes [provider]  Magnesium 200 MG TABS Take 200 mg by mouth daily.   Yes [provider]  methocarbamol (ROBAXIN) 500 MG tablet Take 1 tablet (500 mg total) by mouth every 8  (eight) hours as needed for muscle spasms. 10/11/20  Yes Davonna Belling, MD  mometasone-formoterol (DULERA) 100-5 MCG/ACT AERO Inhale 2 puffs into the lungs 2 (two) times daily.   Yes [provider]  Multiple Vitamin (MULTIVITAMIN WITH MINERALS) TABS tablet Take 1 tablet by mouth daily.   Yes [provider]  nebivolol (BYSTOLIC) 5 MG tablet Take 5 mg by mouth daily.   Yes [provider]  omeprazole (PRILOSEC) 20 MG capsule Take 1-2 capsules (20-40 mg total) by mouth daily as needed. 01/10/19  Yes Chenae Brager, Carlota Raspberry, MD  polyvinyl alcohol (LIQUIFILM TEARS) 1.4 % ophthalmic solution Place 1 drop into both eyes as needed for dry eyes.   Yes [provider]  Probiotic Product (ALIGN) 4 MG CAPS Take 1 capsule by mouth daily.   Yes [provider]  Turmeric (QC TUMERIC COMPLEX PO) Take 300 mg by mouth daily.   Yes [provider]  vitamin B-12 (CYANOCOBALAMIN) 1000 MCG tablet Take 3,000 mcg by mouth daily.   Yes [provider]  Vitamin D, Cholecalciferol, 10 MCG (400 UNIT) CAPS Take 1 capsule by mouth daily.   Yes [provider]  Albuterol Sulfate (PROAIR RESPICLICK) 314 (90 Base) MCG/ACT AEPB Inhale 1 puff into the lungs every 4 (four) hours as needed (sob).  [provider]  aspirin 325 MG tablet Take 650 mg by mouth every 4 (four) hours as needed for mild pain or headache. Patient not taking: No sig reported    [provider]  BELSOMRA 10 MG TABS Take 1 tablet by mouth at bedtime as needed. 10/29/20   [provider]  cetirizine (ZYRTEC) 10 MG tablet Take 10 mg by mouth daily.    [provider]  desipramine (NORPRAMIN) 25 MG tablet Take 25 mg by mouth daily.    [provider]  diclofenac Sodium (VOLTAREN) 1 % GEL Apply topically. 08/28/20   [provider]  dicyclomine (BENTYL) 20 MG tablet Take by mouth. 11/19/18   [provider]  loperamide (IMODIUM) 2  MG capsule Take 2 mg by mouth as needed for diarrhea or loose stools.    [provider]  meclizine (ANTIVERT) 25 MG tablet Take 25 mg by mouth as needed for dizziness.    [provider]  montelukast (SINGULAIR) 10 MG tablet Take 10 mg by mouth daily. 10/25/20   [provider]  naltrexone (DEPADE) 50 MG tablet Take 75 mg by mouth daily.    [provider]  ondansetron (ZOFRAN) 4 MG tablet Take 1 tablet (4 mg total) by mouth every 6 (six) hours. 02/29/20   Gareth Morgan, MD  Potassium 99 MG TABS Take 99 mg by mouth daily. Patient not taking: No sig reported    [provider]    Current Outpatient Medications  Medication Sig Dispense Refill   amLODipine (NORVASC) 5 MG tablet Take 5 mg by mouth daily as needed (chest pain).     Ascorbic Acid (VITAMIN C) 1000 MG tablet Take 1,000 mg by mouth daily.     BROMELAINS PO Take 1 tablet by mouth daily.     calcium carbonate (OS-CAL) 1250 (500 Ca) MG chewable tablet Chew 1 tablet by mouth 2 (two) times daily.     fluticasone (FLONASE) 50 MCG/ACT nasal spray Place 1 spray into both nostrils 2 (two) times daily.     ibuprofen (ADVIL) 800 MG tablet Take 800 mg by mouth 3 (three) times daily.     Magnesium 200 MG TABS Take 200 mg by mouth daily.     methocarbamol (ROBAXIN) 500 MG tablet Take 1 tablet (500 mg total) by mouth every 8 (eight) hours as needed for muscle spasms. 8 tablet 0   mometasone-formoterol (DULERA) 100-5 MCG/ACT AERO Inhale 2 puffs into the lungs 2 (two) times daily.     Multiple Vitamin (MULTIVITAMIN WITH MINERALS) TABS tablet Take 1 tablet by mouth daily.     nebivolol (BYSTOLIC) 5 MG tablet Take 5 mg by mouth daily.     omeprazole (PRILOSEC) 20 MG capsule Take 1-2 capsules (20-40 mg total) by mouth daily as needed. 180 capsule 3   polyvinyl alcohol (LIQUIFILM TEARS) 1.4 % ophthalmic solution Place 1 drop into both eyes as needed for dry eyes.     Probiotic Product (ALIGN) 4 MG CAPS Take  1 capsule by mouth daily.     Turmeric (QC TUMERIC COMPLEX PO) Take 300 mg by mouth daily.     vitamin B-12 (CYANOCOBALAMIN) 1000 MCG tablet Take 3,000 mcg by mouth daily.     Vitamin D, Cholecalciferol, 10 MCG (400 UNIT) CAPS Take 1 capsule by mouth daily.     Albuterol Sulfate (PROAIR RESPICLICK) 195 (90 Base) MCG/ACT AEPB Inhale 1 puff into the lungs every 4 (four) hours as needed (sob).  aspirin 325 MG tablet Take 650 mg by mouth every 4 (four) hours as needed for mild pain or headache. (Patient not taking: No sig reported)     BELSOMRA 10 MG TABS Take 1 tablet by mouth at bedtime as needed.     cetirizine (ZYRTEC) 10 MG tablet Take 10 mg by mouth daily.     desipramine (NORPRAMIN) 25 MG tablet Take 25 mg by mouth daily.     diclofenac Sodium (VOLTAREN) 1 % GEL Apply topically.     dicyclomine (BENTYL) 20 MG tablet Take by mouth.     loperamide (IMODIUM) 2 MG capsule Take 2 mg by mouth as needed for diarrhea or loose stools.     meclizine (ANTIVERT) 25 MG tablet Take 25 mg by mouth as needed for dizziness.     montelukast (SINGULAIR) 10 MG tablet Take 10 mg by mouth daily.     naltrexone (DEPADE) 50 MG tablet Take 75 mg by mouth daily.     ondansetron (ZOFRAN) 4 MG tablet Take 1 tablet (4 mg total) by mouth every 6 (six) hours. 12 tablet 0   Potassium 99 MG TABS Take 99 mg by mouth daily. (Patient not taking: No sig reported)     Current Facility-Administered Medications  Medication Dose Route Frequency Provider Last Rate Last Admin   0.9 %  sodium chloride infusion  500 mL Intravenous Once Sreenidhi Ganson, Carlota Raspberry, MD        Allergies as of 11/07/2020 - Review Complete 11/07/2020  Allergen Reaction Noted   Penicillin g  01/28/2018   Apple Swelling 01/28/2018   Other  10/24/2020    Family History  Problem Relation Age of Onset   Colon polyps Mother    Breast cancer Mother    Heart disease Mother    Irritable bowel syndrome Mother    Kidney disease Mother    Lung cancer Brother     Breast cancer Paternal Aunt    Esophageal cancer Neg Hx    Colon cancer Neg Hx    Rectal cancer Neg Hx    Stomach cancer Neg Hx     Social History   Socioeconomic History   Marital status: Married    Spouse name: Not on file   Number of children: 0   Years of education: Not on file   Highest education level: Not on file  Occupational History   Occupation: Chemical engineer  Tobacco Use   Smoking status: Former    Types: Cigarettes    Quit date: 1995    Years since quitting: 27.8   Smokeless tobacco: Never  Vaping Use   Vaping Use: Never used  Substance and Sexual Activity   Alcohol use: Never   Drug use: Never   Sexual activity: Not on file  Other Topics Concern   Not on file  Social History Narrative   Not on file   Social Determinants of Health   Financial Resource Strain: Not on file  Food Insecurity: Not on file  Transportation Needs: Not on file  Physical Activity: Not on file  Stress: Not on file  Social Connections: Not on file  Intimate Partner Violence: Not on file    Review of Systems: All other review of systems negative except as mentioned in the HPI.  Physical Exam: Vital signs BP (!) 146/75   Pulse 60   Temp (!) 97.3 F (36.3 C)   Resp 13   Ht 5' (1.524 m)   Wt 153 lb (69.4 kg)  SpO2 100%   BMI 29.88 kg/m   General:   Alert,  Well-developed, pleasant and cooperative in NAD Lungs:  Clear throughout to auscultation.   Heart:  Regular rate and rhythm Abdomen:  Soft, nontender and nondistended.   Neuro/Psych:  Alert and cooperative. Normal mood and affect. A and O x 3  Jolly Mango, MD [facility]

## 2020-11-12 ENCOUNTER — Telehealth: Payer: Self-pay

## 2020-11-12 NOTE — Telephone Encounter (Signed)
  Follow up Call-  Call back number 11/07/2020 01/19/2019  Post procedure Call Back phone  # [phone]  Permission to leave phone message Yes Yes     Patient questions:  Do you have a fever, pain , or abdominal swelling? No. Pain Score  0 *  Have you tolerated food without any problems? Yes.    Have you been able to return to your normal activities? Yes.    Do you have any questions about your discharge instructions: Diet   No. Medications  No. Follow up visit  No.  Do you have questions or concerns about your Care? No.  Actions: * If pain score is 4 or above: No action needed, pain <4.

## 2020-12-07 ENCOUNTER — Emergency Department (HOSPITAL_COMMUNITY)
Admission: EM | Admit: 2020-12-07 | Discharge: 2020-12-07 | Disposition: A | Discharge status: Home / Self Care | Payer: BC Managed Care – PPO | Attending: Emergency Medicine | Admitting: Emergency Medicine

## 2020-12-07 DIAGNOSIS — I10 Essential (primary) hypertension: Secondary | ICD-10-CM | POA: Diagnosis not present

## 2020-12-07 DIAGNOSIS — Z87891 Personal history of nicotine dependence: Secondary | ICD-10-CM | POA: Diagnosis not present

## 2020-12-07 DIAGNOSIS — R030 Elevated blood-pressure reading, without diagnosis of hypertension: Secondary | ICD-10-CM | POA: Diagnosis present

## 2020-12-07 DIAGNOSIS — Z79899 Other long term (current) drug therapy: Secondary | ICD-10-CM | POA: Diagnosis not present

## 2020-12-07 DIAGNOSIS — J45909 Unspecified asthma, uncomplicated: Secondary | ICD-10-CM | POA: Insufficient documentation

## 2020-12-07 DIAGNOSIS — Z7951 Long term (current) use of inhaled steroids: Secondary | ICD-10-CM | POA: Insufficient documentation

## 2020-12-07 NOTE — ED Provider Notes (Signed)
[place] DEPT Provider Note   CSN: [id number] Arrival date & time: 12/07/20  0844     History Chief Complaint  Patient presents with   Hypertension    Rebecca Gentry is a 55 y.o. female with a past medical history of anxiety, fibromyalgia and hypertension presenting today with a complaint of high blood pressure.  She reports that she checked her blood pressure this morning and it was 180/101.  Did not have any symptoms however was worried that she should come to the emergency department.  She is currently prescribed amlodipine and Bystolic for her hypertension.  She reports that she was recently decreased to one half of a pill of both due to headaches.  She continue to get headaches and stopped taking the medication.  Denies any headaches, difficulty breathing or chest pain.  She reports some palpitations.  She is also unsure whether or not her blood pressure cuff is accurate.   Past Medical History:  Diagnosis Date   Allergy    Anal fissure    Anemia    Anxiety    Arthritis    Asthma    Depression    Diverticulosis    Fibromyalgia    GERD (gastroesophageal reflux disease)    HTN (hypertension)    Irritable bowel syndrome    Sinus arrhythmia    Sleep apnea     There are no problems to display for this patient.   Past Surgical History:  Procedure Laterality Date   ABDOMINAL HYSTERECTOMY     COLONOSCOPY     LEEP     UPPER GASTROINTESTINAL ENDOSCOPY  2015     OB History   No obstetric history on file.     Family History  Problem Relation Age of Onset   Colon polyps Mother    Breast cancer Mother    Heart disease Mother    Irritable bowel syndrome Mother    Kidney disease Mother    Lung cancer Brother    Breast cancer Paternal Aunt    Esophageal cancer Neg Hx    Colon cancer Neg Hx    Rectal cancer Neg Hx    Stomach cancer Neg Hx     Social History   Tobacco Use   Smoking status: Former    Types: Cigarettes    Quit  date: 1995    Years since quitting: 27.9   Smokeless tobacco: Never  Vaping Use   Vaping Use: Never used  Substance Use Topics   Alcohol use: Never   Drug use: Never    Home Medications Prior to Admission medications   Medication Sig Start Date End Date Taking? Authorizing Provider  Albuterol Sulfate (PROAIR RESPICLICK) 962 (90 Base) MCG/ACT AEPB Inhale 1 puff into the lungs every 4 (four) hours as needed (sob).    [provider]  amLODipine (NORVASC) 5 MG tablet Take 5 mg by mouth daily as needed (chest pain).    [provider]  Ascorbic Acid (VITAMIN C) 1000 MG tablet Take 1,000 mg by mouth daily.    [provider]  aspirin 325 MG tablet Take 650 mg by mouth every 4 (four) hours as needed for mild pain or headache. Patient not taking: No sig reported    [provider]  BELSOMRA 10 MG TABS Take 1 tablet by mouth at bedtime as needed. 10/29/20   [provider]  BROMELAINS PO Take 1 tablet by mouth daily.    [provider]  calcium carbonate (OS-CAL)  1250 (500 Ca) MG chewable tablet Chew 1 tablet by mouth 2 (two) times daily.    [provider]  cetirizine (ZYRTEC) 10 MG tablet Take 10 mg by mouth daily.    [provider]  desipramine (NORPRAMIN) 25 MG tablet Take 25 mg by mouth daily.    [provider]  diclofenac Sodium (VOLTAREN) 1 % GEL Apply topically. 08/28/20   [provider]  dicyclomine (BENTYL) 20 MG tablet Take by mouth. 11/19/18   [provider]  fluticasone (FLONASE) 50 MCG/ACT nasal spray Place 1 spray into both nostrils 2 (two) times daily.    [provider]  ibuprofen (ADVIL) 800 MG tablet Take 800 mg by mouth 3 (three) times daily. 09/17/20   [provider]  loperamide (IMODIUM) 2 MG capsule Take 2 mg by mouth as needed for diarrhea or loose stools.    [provider]  Magnesium 200 MG TABS Take 200 mg by mouth daily.    [provider]  meclizine (ANTIVERT) 25 MG tablet Take 25 mg by mouth as needed for dizziness.    [provider]  methocarbamol (ROBAXIN) 500 MG tablet Take 1 tablet (500 mg total) by mouth every 8 (eight) hours as needed for muscle spasms. 10/11/20   Davonna Belling, MD  mometasone-formoterol (DULERA) 100-5 MCG/ACT AERO Inhale 2 puffs into the lungs 2 (two) times daily.    [provider]  montelukast (SINGULAIR) 10 MG tablet Take 10 mg by mouth daily. 10/25/20   [provider]  Multiple Vitamin (MULTIVITAMIN WITH MINERALS) TABS tablet Take 1 tablet by mouth daily.    [provider]  naltrexone (DEPADE) 50 MG tablet Take 75 mg by mouth daily.    [provider]  nebivolol (BYSTOLIC) 5 MG tablet Take 5 mg by mouth daily.    [provider]  omeprazole (PRILOSEC) 20 MG capsule Take 1-2 capsules (20-40 mg total) by mouth daily as needed. 01/10/19   Armbruster, Carlota Raspberry, MD  ondansetron (ZOFRAN) 4 MG tablet Take 1 tablet (4 mg total) by mouth every 6 (six) hours. 02/29/20   Gareth Morgan, MD  polyvinyl alcohol (LIQUIFILM TEARS) 1.4 % ophthalmic solution Place 1 drop into both eyes as needed for dry eyes.    [provider]  Potassium 99 MG TABS Take 99 mg by mouth daily. Patient not taking: No sig reported    [provider]  Probiotic Product (ALIGN) 4 MG CAPS Take 1 capsule by mouth daily.    [provider]  Turmeric (QC TUMERIC COMPLEX PO) Take 300 mg by mouth daily.    [provider]  vitamin B-12 (CYANOCOBALAMIN) 1000 MCG tablet Take 3,000 mcg by mouth daily.    [provider]  Vitamin D, Cholecalciferol, 10 MCG (400 UNIT) CAPS Take 1 capsule by mouth daily.    [provider]    Allergies    Penicillin g, Apple, and Other  Review of Systems   Review of Systems  Respiratory:  Negative for shortness of breath.   Cardiovascular:  Positive for palpitations. Negative for chest  pain.  Gastrointestinal:  Negative for abdominal pain and nausea.  Neurological:  Negative for headaches.  Psychiatric/Behavioral:  The patient is nervous/anxious.   All other systems reviewed and are negative.  Physical Exam Updated Vital Signs BP (!) 168/86 (BP Location: Right Arm)   Pulse (!) 102   Temp 98.1 F (36.7 C) (Oral)   Resp 18   Ht 5\' 6"  (  1.676 m)   Wt 68.5 kg   SpO2 97%   BMI 24.37 kg/m   Physical Exam Vitals and nursing note reviewed.  Constitutional:      General: She is not in acute distress.    Appearance: Normal appearance. She is not ill-appearing.  HENT:     Head: Normocephalic and atraumatic.  Eyes:     General: No scleral icterus.    Conjunctiva/sclera: Conjunctivae normal.  Cardiovascular:     Rate and Rhythm: Normal rate and regular rhythm.  Pulmonary:     Effort: Pulmonary effort is normal. No respiratory distress.     Breath sounds: No wheezing or rales.  Skin:    Findings: No rash.  Neurological:     Mental Status: She is alert.  Psychiatric:     Comments: Anxious with abnormal affect    ED Results / Procedures / Treatments   Labs (all labs ordered are listed, but only abnormal results are displayed) Labs Reviewed - No data to display  EKG None  Radiology No results found.  Procedures Procedures   Medications Ordered in ED Medications - No data to display  ED Course  I have reviewed the triage vital signs and the nursing notes.  Pertinent labs & imaging results that were available during my care of the patient were reviewed by me and considered in my medical decision making (see chart for details).    MDM Rules/Calculators/A&P Patient was fully evaluated by me.  She was clearly anxious.  Her loved one is at the bedside and reports that he is "trying to get her to calm down."  He is also unsure whether or not her blood pressure cuff is accurate.  She has not been majorly hypertensive in our department today. Original blood  pressure 168/86.  Likely due to noncompliance with her hypertension medications.  No signs of hypertensive emergency.  I believe she needs to restart her blood pressure medications and follow-up with her primary care provider with a diary.  EKG without acute processes.  Confirmed by MD Pearline Cables.  Final Clinical Impression(s) / ED Diagnoses Final diagnoses:  Hypertension, unspecified type    Rx / DC Orders Results and diagnoses were explained to the patient. Return precautions discussed in full. Patient had no additional questions and expressed complete understanding.     Rhae Hammock, PA-C 12/07/20 [place], DO 12/07/20 1621

## 2020-12-07 NOTE — ED Triage Notes (Signed)
Patient reports waking up feeling weak and dizzy, checked her bp at home and it was 180/101. History of bp with htn medications. Recent changes to medications. Bp in triage 144/92. Patient reports feeling okay in triage and says she feels better.

## 2020-12-07 NOTE — Discharge Instructions (Addendum)
Your EKG looks okay today.  Please take your blood pressure medication as prescribed unless you speak with your primary care provider.  Also keep a diary of your blood pressures each morning and bring it to your primary care as well as your blood pressure cuff so that they may assess its function.  Information about hypertension attached to these discharge papers.

## 2021-01-12 DIAGNOSIS — E785 Hyperlipidemia, unspecified: Secondary | ICD-10-CM

## 2021-01-12 HISTORY — DX: Hyperlipidemia, unspecified: E78.5

## 2021-07-30 ENCOUNTER — Other Ambulatory Visit: Payer: Self-pay | Admitting: Family Medicine

## 2021-07-30 DIAGNOSIS — Z1231 Encounter for screening mammogram for malignant neoplasm of breast: Secondary | ICD-10-CM

## 2021-09-12 ENCOUNTER — Ambulatory Visit
Admission: RE | Admit: 2021-09-12 | Discharge: 2021-09-12 | Disposition: A | Discharge status: Home / Self Care | Payer: BC Managed Care – PPO | Source: Ambulatory Visit | Attending: Family Medicine | Admitting: Family Medicine

## 2021-09-12 DIAGNOSIS — Z1231 Encounter for screening mammogram for malignant neoplasm of breast: Secondary | ICD-10-CM

## 2021-10-19 ENCOUNTER — Emergency Department (HOSPITAL_COMMUNITY)
Admission: EM | Admit: 2021-10-19 | Discharge: 2021-10-19 | Disposition: A | Discharge status: Home / Self Care | Payer: BC Managed Care – PPO | Attending: Emergency Medicine | Admitting: Emergency Medicine

## 2021-10-19 ENCOUNTER — Encounter (HOSPITAL_COMMUNITY): Payer: Self-pay

## 2021-10-19 ENCOUNTER — Other Ambulatory Visit: Payer: Self-pay

## 2021-10-19 DIAGNOSIS — I10 Essential (primary) hypertension: Secondary | ICD-10-CM | POA: Insufficient documentation

## 2021-10-19 DIAGNOSIS — Z7982 Long term (current) use of aspirin: Secondary | ICD-10-CM | POA: Diagnosis not present

## 2021-10-19 DIAGNOSIS — K625 Hemorrhage of anus and rectum: Secondary | ICD-10-CM | POA: Insufficient documentation

## 2021-10-19 DIAGNOSIS — R112 Nausea with vomiting, unspecified: Secondary | ICD-10-CM | POA: Insufficient documentation

## 2021-10-19 DIAGNOSIS — R109 Unspecified abdominal pain: Secondary | ICD-10-CM | POA: Diagnosis not present

## 2021-10-19 DIAGNOSIS — R6883 Chills (without fever): Secondary | ICD-10-CM | POA: Diagnosis not present

## 2021-10-19 DIAGNOSIS — J45909 Unspecified asthma, uncomplicated: Secondary | ICD-10-CM | POA: Insufficient documentation

## 2021-10-19 DIAGNOSIS — Z7951 Long term (current) use of inhaled steroids: Secondary | ICD-10-CM | POA: Insufficient documentation

## 2021-10-19 DIAGNOSIS — Z79899 Other long term (current) drug therapy: Secondary | ICD-10-CM | POA: Diagnosis not present

## 2021-10-19 DIAGNOSIS — Z20822 Contact with and (suspected) exposure to covid-19: Secondary | ICD-10-CM | POA: Insufficient documentation

## 2021-10-19 DIAGNOSIS — R197 Diarrhea, unspecified: Secondary | ICD-10-CM | POA: Diagnosis not present

## 2021-10-19 LAB — URINALYSIS, ROUTINE W REFLEX MICROSCOPIC
Bilirubin Urine: NEGATIVE
Glucose, UA: NEGATIVE mg/dL
Hgb urine dipstick: NEGATIVE
Ketones, ur: NEGATIVE mg/dL
Leukocytes,Ua: NEGATIVE
Nitrite: NEGATIVE
Protein, ur: NEGATIVE mg/dL
Specific Gravity, Urine: 1.024 (ref 1.005–1.030)
pH: 5 (ref 5.0–8.0)

## 2021-10-19 LAB — COMPREHENSIVE METABOLIC PANEL
ALT: 18 U/L (ref 0–44)
AST: 17 U/L (ref 15–41)
Albumin: 3.9 g/dL (ref 3.5–5.0)
Alkaline Phosphatase: 63 U/L (ref 38–126)
Anion gap: 7 (ref 5–15)
BUN: 12 mg/dL (ref 6–20)
CO2: 24 mmol/L (ref 22–32)
Calcium: 9.3 mg/dL (ref 8.9–10.3)
Chloride: 110 mmol/L (ref 98–111)
Creatinine, Ser: 0.74 mg/dL (ref 0.44–1.00)
GFR, Estimated: >60 mL/min (ref >60)
Glucose, Bld: 100 mg/dL — ABNORMAL HIGH (ref 70–99)
Potassium: 3.6 mmol/L (ref 3.5–5.1)
Sodium: 141 mmol/L (ref 135–145)
Total Bilirubin: 0.4 mg/dL (ref 0.3–1.2)
Total Protein: 6.9 g/dL (ref 6.5–8.1)

## 2021-10-19 LAB — CBC WITH DIFFERENTIAL/PLATELET
Abs Immature Granulocytes: 0.01 10*3/uL (ref 0.00–0.07)
Basophils Absolute: 0 10*3/uL (ref 0.0–0.1)
Basophils Relative: 0 %
Eosinophils Absolute: 0.1 10*3/uL (ref 0.0–0.5)
Eosinophils Relative: 2 %
HCT: 41.4 % (ref 36.0–46.0)
Hemoglobin: 13.3 g/dL (ref 12.0–15.0)
Immature Granulocytes: 0 %
Lymphocytes Relative: 25 %
Lymphs Abs: 1.7 10*3/uL (ref 0.7–4.0)
MCH: 27.4 pg (ref 26.0–34.0)
MCHC: 32.1 g/dL (ref 30.0–36.0)
MCV: 85.4 fL (ref 80.0–100.0)
Monocytes Absolute: 0.5 10*3/uL (ref 0.1–1.0)
Monocytes Relative: 7 %
Neutro Abs: 4.5 10*3/uL (ref 1.7–7.7)
Neutrophils Relative %: 66 %
Platelets: 273 10*3/uL (ref 150–400)
RBC: 4.85 MIL/uL (ref 3.87–5.11)
RDW: 13.9 % (ref 11.5–15.5)
WBC: 6.9 10*3/uL (ref 4.0–10.5)
nRBC: 0 % (ref 0.0–0.2)

## 2021-10-19 LAB — RESP PANEL BY RT-PCR (FLU A&B, COVID) ARPGX2
Influenza A by PCR: NEGATIVE
Influenza B by PCR: NEGATIVE
SARS Coronavirus 2 by RT PCR: NEGATIVE

## 2021-10-19 LAB — LIPASE, BLOOD: Lipase: 37 U/L (ref 11–51)

## 2021-10-19 LAB — POC OCCULT BLOOD, ED: Fecal Occult Bld: NEGATIVE

## 2021-10-19 MED ORDER — SODIUM CHLORIDE 0.9 % IV BOLUS
1000.0000 mL | Freq: Once | INTRAVENOUS | Status: AC
  Administered 2021-10-19: 1000 mL via INTRAVENOUS

## 2021-10-19 MED ORDER — ONDANSETRON 4 MG PO TBDP: 4.0000 mg | ORAL_TABLET | Freq: Three times a day (TID) | ORAL | 0 refills | Status: DC | PRN

## 2021-10-19 MED ORDER — LOPERAMIDE HCL 2 MG PO CAPS
4.0000 mg | ORAL_CAPSULE | Freq: Once | ORAL | Status: DC
  Filled 2021-10-19: qty 2

## 2021-10-19 MED ORDER — ONDANSETRON 4 MG PO TBDP
4.0000 mg | ORAL_TABLET | Freq: Once | ORAL | Status: AC
  Administered 2021-10-19: 4 mg via ORAL
  Filled 2021-10-19: qty 1

## 2021-10-19 MED ORDER — KETOROLAC TROMETHAMINE 15 MG/ML IJ SOLN
15.0000 mg | Freq: Once | INTRAMUSCULAR | Status: AC
  Administered 2021-10-19: 15 mg via INTRAVENOUS
  Filled 2021-10-19: qty 1

## 2021-10-19 MED ORDER — LOPERAMIDE HCL 2 MG PO CAPS: 2.0000 mg | ORAL_CAPSULE | ORAL | 0 refills | Status: AC | PRN

## 2021-10-19 NOTE — ED Provider Notes (Signed)
[place] DEPT Provider Note   CSN: [id number] Arrival date & time: 10/19/21  0610     History  Chief Complaint  Patient presents with   Abdominal Pain   Rectal Bleeding    Scarlett Portlock is a 56 y.o. female with a past medical history of hypertension, asthma and GERD presenting today with abdominal cramping and diarrhea with occasional bright red blood since yesterday.  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  She also says that on Thursday and Friday she ate at a restaurant she does not usually eat.  No food allergies.  So that while in the waiting room she became nauseated and had 1 episode of nonbloody nonbilious emesis.  No fevers but chills and myalgias.  No history of GI bleed, denies alcohol or NSAIDs.  No recent travel or antibiotic use will use this is also been made worse by her fibromyalgia    Abdominal Pain Associated symptoms: chills, diarrhea, hematochezia, nausea and vomiting   Associated symptoms: no dysuria, no fever and no hematuria   Rectal Bleeding Associated symptoms: abdominal pain and vomiting   Associated symptoms: no fever        Home Medications Prior to Admission medications   Medication Sig Start Date End Date Taking? Authorizing Provider  Albuterol Sulfate (PROAIR RESPICLICK) 829 (90 Base) MCG/ACT AEPB Inhale 1 puff into the lungs every 4 (four) hours as needed (sob).    [provider]  amLODipine (NORVASC) 5 MG tablet Take 5 mg by mouth daily as needed (chest pain).    [provider]  Ascorbic Acid (VITAMIN C) 1000 MG tablet Take 1,000 mg by mouth daily.    [provider]  aspirin 325 MG tablet Take 650 mg by mouth every 4 (four) hours as needed for mild pain or headache. Patient not taking: No sig reported    [provider]  BELSOMRA 10 MG TABS Take 1 tablet by mouth at bedtime as needed. 10/29/20   [provider]   BROMELAINS PO Take 1 tablet by mouth daily.    [provider]  calcium carbonate (OS-CAL) 1250 (500 Ca) MG chewable tablet Chew 1 tablet by mouth 2 (two) times daily.    [provider]  cetirizine (ZYRTEC) 10 MG tablet Take 10 mg by mouth daily.    [provider]  desipramine (NORPRAMIN) 25 MG tablet Take 25 mg by mouth daily.    [provider]  diclofenac Sodium (VOLTAREN) 1 % GEL Apply topically. 08/28/20   [provider]  dicyclomine (BENTYL) 20 MG tablet Take by mouth. 11/19/18   [provider]  fluticasone (FLONASE) 50 MCG/ACT nasal spray Place 1 spray into both nostrils 2 (two) times daily.    [provider]  ibuprofen (ADVIL) 800 MG tablet Take 800 mg by mouth 3 (three) times daily. 09/17/20   [provider]  loperamide (IMODIUM) 2 MG capsule Take 2 mg by mouth as needed for diarrhea or loose stools.    [provider]  Magnesium 200 MG TABS Take 200 mg by mouth daily.    [provider]  meclizine (ANTIVERT) 25 MG tablet Take 25 mg by mouth as needed for dizziness.    [provider]  methocarbamol (ROBAXIN) 500 MG tablet Take 1 tablet (500 mg total) by mouth every 8 (eight) hours as needed for muscle spasms. 10/11/20   Davonna Belling, MD  mometasone-formoterol [facility]) 100-5  MCG/ACT AERO Inhale 2 puffs into the lungs 2 (two) times daily.    [provider]  montelukast (SINGULAIR) 10 MG tablet Take 10 mg by mouth daily. 10/25/20   [provider]  Multiple Vitamin (MULTIVITAMIN WITH MINERALS) TABS tablet Take 1 tablet by mouth daily.    [provider]  naltrexone (DEPADE) 50 MG tablet Take 75 mg by mouth daily.    [provider]  nebivolol (BYSTOLIC) 5 MG tablet Take 5 mg by mouth daily.    [provider]  omeprazole (PRILOSEC) 20 MG capsule Take 1-2 capsules (20-40 mg total) by mouth daily as needed. 01/10/19   Armbruster, Carlota Raspberry, MD   ondansetron (ZOFRAN) 4 MG tablet Take 1 tablet (4 mg total) by mouth every 6 (six) hours. 02/29/20   Gareth Morgan, MD  polyvinyl alcohol (LIQUIFILM TEARS) 1.4 % ophthalmic solution Place 1 drop into both eyes as needed for dry eyes.    [provider]  Potassium 99 MG TABS Take 99 mg by mouth daily. Patient not taking: No sig reported    [provider]  Probiotic Product (ALIGN) 4 MG CAPS Take 1 capsule by mouth daily.    [provider]  Turmeric (QC TUMERIC COMPLEX PO) Take 300 mg by mouth daily.    [provider]  vitamin B-12 (CYANOCOBALAMIN) 1000 MCG tablet Take 3,000 mcg by mouth daily.    [provider]  Vitamin D, Cholecalciferol, 10 MCG (400 UNIT) CAPS Take 1 capsule by mouth daily.    [provider]      Allergies    Penicillin g, Apple juice, and Other    Review of Systems   Review of Systems  Constitutional:  Positive for chills. Negative for fever.  Gastrointestinal:  Positive for abdominal pain, blood in stool, diarrhea, hematochezia, nausea and vomiting.  Genitourinary:  Negative for dysuria and hematuria.  Musculoskeletal:  Positive for myalgias.    Physical Exam Updated Vital Signs BP 121/60 (BP Location: Left Arm)   Pulse 61   Temp 98.1 F (36.7 C) (Oral)   Resp 18   Ht '5\' 6"'$  (1.676 m)   Wt 68 kg   SpO2 100%   BMI 24.21 kg/m  Physical Exam Vitals and nursing note reviewed.  Constitutional:      General: She is not in acute distress.    Appearance: Normal appearance. She is not ill-appearing.  HENT:     Head: Normocephalic and atraumatic.  Eyes:     General: No scleral icterus.    Conjunctiva/sclera: Conjunctivae normal.  Pulmonary:     Effort: Pulmonary effort is normal. No respiratory distress.  Abdominal:     General: Abdomen is flat.     Palpations: Abdomen is soft.     Tenderness: There is no abdominal tenderness.     Hernia: No hernia is present.  Genitourinary:    Rectum: Normal.      Comments: Rectal exam performed in presence of nurse tech.  Patient had no external hemorrhoids.  Internal exam within normal limits.  No signs of internal hemorrhoids or fissures.  Stool tan in appearance.  Normal rectal tone Skin:    General: Skin is warm and dry.     Findings: No rash.  Neurological:     Mental Status: She is alert.  Psychiatric:        Mood and Affect: Mood normal.     ED Results / Procedures / Treatments   Labs (all labs ordered are  listed, but only abnormal results are displayed) Labs Reviewed  COMPREHENSIVE METABOLIC PANEL - Abnormal; Notable for the following components:      Result Value   Glucose, Bld 100 (*)    All other components within normal limits  RESP PANEL BY RT-PCR (FLU A&B, COVID) ARPGX2  LIPASE, BLOOD  CBC WITH DIFFERENTIAL/PLATELET  URINALYSIS, ROUTINE W REFLEX MICROSCOPIC  POC OCCULT BLOOD, ED    EKG None  Radiology No results found.  Procedures Procedures   Medications Ordered in ED Medications  ondansetron (ZOFRAN-ODT) disintegrating tablet 4 mg (has no administration in time range)  sodium chloride 0.9 % bolus 1,000 mL (has no administration in time range)  ketorolac (TORADOL) 15 MG/ML injection 15 mg (has no administration in time range)    ED Course/ Medical Decision Making/ A&P                           Medical Decision Making Amount and/or Complexity of Data Reviewed Labs: ordered.  Risk Prescription drug management.   This is a 56 year old female presenting today with abdominal pain. And occasionally bloody diarrhea.  Differential includes but is not limited to colitis, gastroenteritis, diverticular bleed, GERD, gastritis viral illness, appendicitis, cholecystitis and pancreatitis.   This is not an exhaustive differential.    Past Medical History / Co-morbidities / Social History: Fibromyalgia, diverticulosis      Physical Exam: Pertinent physical exam findings include Very mildly tender abdomen.  No  LLQ tenderness suspicious for diverticulitis  Lab Tests: I ordered, and personally interpreted labs.  The pertinent results include: Normal white count and normal hemoglobin   Imaging Studies: Patient had nearly no tenderness on physical exam.  Maybe some generalized tenderness to deep palpation.  Shared decision-making was used in regards to his CT abdomen and pelvis.  Ultimately patient and her husband agree that this is likely not necessary at this time and we will treat her symptoms.   Medications: I ordered medication including fluids and Toradol.  I reevaluated the patient after this and she said her pain level was a 2.  Said that she did not need Imodium.    MDM/Disposition: She had very minimal abdominal tenderness.  No LLQ tenderness making me concerned for diverticulitis.  She also is afebrile with normal white count so this is well as appendicitis, pancreatitis and severe Colitis are lower on my differential.  She was given Toradol, Zofran and fluids.  She says that she would prefer not to take the Imodium and would like to be discharged home at this time.  I told her I would prefer for p.o. challenge but they report that she can eat oatmeal at home.  Says that she feels better and is ready to go.  Hemodynamically stable, normal vital signs, afebrile with a normal white count.  Low suspicion severe disease and will be discharged.  She reports she would prefer to follow-up on COVID and flu testing online as it will not change our plan.      Final Clinical Impression(s) / ED Diagnoses Final diagnoses:  Diarrhea, unspecified type    Rx / DC Orders ED Discharge Orders          Ordered    loperamide (IMODIUM) 2 MG capsule  As needed        10/19/21 1159    ondansetron (ZOFRAN-ODT) 4 MG disintegrating tablet  Every 8 hours PRN        10/19/21 1209  Results and diagnoses were explained to the patient. Return precautions discussed in full. Patient had no additional  questions and expressed complete understanding.   This chart was dictated using voice recognition software.  Despite best efforts to proofread,  errors can occur which can change the documentation meaning.     Darliss Ridgel 10/19/21 1212    Hayden Rasmussen, MD 10/19/21 1750

## 2021-10-19 NOTE — Discharge Instructions (Addendum)
Read the information about diarrhea as well as food choices to help relieve it.  Please return with any worsening symptoms, otherwise you may see your PCP.  The nausea and diarrhea medications are at your pharmacy.  You may take Tylenol for any discomfort.  Avoid ibuprofen for the time being.  It was a pleasure to meet you and I hope you feel better.  Your work note is attached.  You will see your COVID and flu results online.

## 2021-10-19 NOTE — ED Triage Notes (Signed)
Pt reports with abdominal pain and bloody diarrhea intermittently since yesterday. Pt states that she has a hx of diverticulosis.

## 2021-12-20 ENCOUNTER — Other Ambulatory Visit: Payer: Self-pay

## 2021-12-20 ENCOUNTER — Encounter (HOSPITAL_BASED_OUTPATIENT_CLINIC_OR_DEPARTMENT_OTHER): Payer: Self-pay | Admitting: Emergency Medicine

## 2021-12-20 ENCOUNTER — Emergency Department (HOSPITAL_BASED_OUTPATIENT_CLINIC_OR_DEPARTMENT_OTHER)
Admission: EM | Admit: 2021-12-20 | Discharge: 2021-12-21 | Disposition: A | Discharge status: Home / Self Care | Payer: BC Managed Care – PPO | Attending: Emergency Medicine | Admitting: Emergency Medicine

## 2021-12-20 DIAGNOSIS — R110 Nausea: Secondary | ICD-10-CM | POA: Diagnosis not present

## 2021-12-20 DIAGNOSIS — Z1152 Encounter for screening for COVID-19: Secondary | ICD-10-CM | POA: Insufficient documentation

## 2021-12-20 DIAGNOSIS — R519 Headache, unspecified: Secondary | ICD-10-CM | POA: Diagnosis present

## 2021-12-20 DIAGNOSIS — J019 Acute sinusitis, unspecified: Secondary | ICD-10-CM

## 2021-12-20 DIAGNOSIS — Z7982 Long term (current) use of aspirin: Secondary | ICD-10-CM | POA: Diagnosis not present

## 2021-12-20 LAB — CBC WITH DIFFERENTIAL/PLATELET
Abs Immature Granulocytes: 0.02 10*3/uL (ref 0.00–0.07)
Basophils Absolute: 0.1 10*3/uL (ref 0.0–0.1)
Basophils Relative: 1 %
Eosinophils Absolute: 0.3 10*3/uL (ref 0.0–0.5)
Eosinophils Relative: 4 %
HCT: 40 % (ref 36.0–46.0)
Hemoglobin: 13 g/dL (ref 12.0–15.0)
Immature Granulocytes: 0 %
Lymphocytes Relative: 34 %
Lymphs Abs: 2.5 10*3/uL (ref 0.7–4.0)
MCH: 27.4 pg (ref 26.0–34.0)
MCHC: 32.5 g/dL (ref 30.0–36.0)
MCV: 84.4 fL (ref 80.0–100.0)
Monocytes Absolute: 0.5 10*3/uL (ref 0.1–1.0)
Monocytes Relative: 7 %
Neutro Abs: 3.9 10*3/uL (ref 1.7–7.7)
Neutrophils Relative %: 54 %
Platelets: 286 10*3/uL (ref 150–400)
RBC: 4.74 MIL/uL (ref 3.87–5.11)
RDW: 13.6 % (ref 11.5–15.5)
WBC: 7.3 10*3/uL (ref 4.0–10.5)
nRBC: 0 % (ref 0.0–0.2)

## 2021-12-20 LAB — BASIC METABOLIC PANEL
Anion gap: 5 (ref 5–15)
BUN: 12 mg/dL (ref 6–20)
CO2: 27 mmol/L (ref 22–32)
Calcium: 9.3 mg/dL (ref 8.9–10.3)
Chloride: 107 mmol/L (ref 98–111)
Creatinine, Ser: 0.8 mg/dL (ref 0.44–1.00)
GFR, Estimated: >60 mL/min (ref >60)
Glucose, Bld: 93 mg/dL (ref 70–99)
Potassium: 3.6 mmol/L (ref 3.5–5.1)
Sodium: 139 mmol/L (ref 135–145)

## 2021-12-20 LAB — RESP PANEL BY RT-PCR (FLU A&B, COVID) ARPGX2
Influenza A by PCR: NEGATIVE
Influenza B by PCR: NEGATIVE
SARS Coronavirus 2 by RT PCR: NEGATIVE

## 2021-12-20 NOTE — ED Provider Notes (Signed)
[facility] DEPART[facility] Provider Note   CSN: [id number] Arrival date & time: 12/20/21  1651     History  Chief Complaint  Patient presents with   Edema    Rebecca Gentry is a 56 y.o. female presents to the ED complaining of edema to her forehead, nausea, and associated headache.  She also reports "a little bit of congestion", postnasal drip, and some numbness to her forehead.  Patient reports she has not had symptoms like this in the past.  She describes her headache as intermittent and bandlike around both sides of the head.  She reports that the swelling over her forehead has worsened since yesterday.  Denies pain with the swelling.  Denies fever, sinus pain, sinus pressure, sore throat, vomiting, syncope, weakness, eye pain, eye discharge.       Home Medications Prior to Admission medications   Medication Sig Start Date End Date Taking? Authorizing Provider  Albuterol Sulfate (PROAIR RESPICLICK) 248 (90 Base) MCG/ACT AEPB Inhale 1 puff into the lungs every 4 (four) hours as needed (sob).    [provider]  amLODipine (NORVASC) 5 MG tablet Take 5 mg by mouth daily as needed (chest pain).    [provider]  Ascorbic Acid (VITAMIN C) 1000 MG tablet Take 1,000 mg by mouth daily.    [provider]  aspirin 325 MG tablet Take 650 mg by mouth every 4 (four) hours as needed for mild pain or headache. Patient not taking: No sig reported    [provider]  BELSOMRA 10 MG TABS Take 1 tablet by mouth at bedtime as needed. 10/29/20   [provider]  BROMELAINS PO Take 1 tablet by mouth daily.    [provider]  calcium carbonate (OS-CAL) 1250 (500 Ca) MG chewable tablet Chew 1 tablet by mouth 2 (two) times daily.    [provider]  cetirizine (ZYRTEC) 10 MG tablet Take 10 mg by mouth daily.    [provider]  desipramine (NORPRAMIN) 25 MG tablet Take 25 mg by mouth daily.    [provider]  diclofenac Sodium (VOLTAREN) 1 % GEL Apply topically. 08/28/20   [provider]  dicyclomine (BENTYL) 20 MG tablet Take by mouth. 11/19/18   [provider]  fluticasone (FLONASE) 50 MCG/ACT nasal spray Place 1 spray into both nostrils 2 (two) times daily.    [provider]  ibuprofen (ADVIL) 800 MG tablet Take 800 mg by mouth 3 (three) times daily. 09/17/20   [provider]  loperamide (IMODIUM) 2 MG capsule Take 1 capsule (2 mg total) by mouth as needed for diarrhea or loose stools. 10/19/21   Redwine, Madison A, PA-C  Magnesium 200 MG TABS Take 200 mg by mouth daily.    [provider]  meclizine (ANTIVERT) 25 MG tablet Take 25 mg by mouth as needed for dizziness.    [provider]  methocarbamol (ROBAXIN) 500 MG tablet Take 1 tablet (500 mg total) by mouth every 8 (eight) hours as needed for muscle spasms. 10/11/20   Davonna Belling, MD  mometasone-formoterol (DULERA) 100-5 MCG/ACT AERO Inhale 2 puffs into the lungs 2 (two) times daily.    [provider]  montelukast (SINGULAIR) 10 MG tablet Take 10 mg by mouth daily. 10/25/20   [provider]  Multiple Vitamin (MULTIVITAMIN WITH MINERALS) TABS tablet Take 1 tablet by mouth daily.    [provider]  naltrexone (DEPADE) 50 MG tablet Take 75 mg by  mouth daily.    [provider]  nebivolol (BYSTOLIC) 5 MG tablet Take 5 mg by mouth daily.    [provider]  omeprazole (PRILOSEC) 20 MG capsule Take 1-2 capsules (20-40 mg total) by mouth daily as needed. 01/10/19   Armbruster, Carlota Raspberry, MD  ondansetron (ZOFRAN) 4 MG tablet Take 1 tablet (4 mg total) by mouth every 6 (six) hours. 02/29/20   Gareth Morgan, MD  ondansetron (ZOFRAN-ODT) 4 MG disintegrating tablet Take 1 tablet (4 mg total) by mouth every 8 (eight) hours as needed for nausea or vomiting. 10/19/21   Redwine, Madison A, PA-C  polyvinyl alcohol (LIQUIFILM TEARS) 1.4 %  ophthalmic solution Place 1 drop into both eyes as needed for dry eyes.    [provider]  Potassium 99 MG TABS Take 99 mg by mouth daily. Patient not taking: No sig reported    [provider]  Probiotic Product (ALIGN) 4 MG CAPS Take 1 capsule by mouth daily.    [provider]  Turmeric (QC TUMERIC COMPLEX PO) Take 300 mg by mouth daily.    [provider]  vitamin B-12 (CYANOCOBALAMIN) 1000 MCG tablet Take 3,000 mcg by mouth daily.    [provider]  Vitamin D, Cholecalciferol, 10 MCG (400 UNIT) CAPS Take 1 capsule by mouth daily.    [provider]      Allergies    Penicillin g, Apple juice, and Other    Review of Systems   Review of Systems  Constitutional:  Negative for chills and fever.  HENT:  Positive for congestion, facial swelling and postnasal drip. Negative for sinus pressure, sinus pain and sore throat.   Eyes:  Negative for pain and discharge.  Gastrointestinal:  Positive for nausea. Negative for vomiting.  Neurological:  Positive for numbness and headaches. Negative for syncope and weakness.    Physical Exam Updated Vital Signs BP 110/61   Pulse (!) 49   Temp 98.4 F (36.9 C) (Oral)   Resp 18   Ht '5\' 6"'$  (1.676 m)   Wt 68 kg   SpO2 100%   BMI 24.21 kg/m  Physical Exam Vitals and nursing note reviewed.  Constitutional:      General: She is not in acute distress.    Appearance: Normal appearance. She is not ill-appearing or diaphoretic.  HENT:     Head: Atraumatic. No contusion or masses.     Jaw: There is normal jaw occlusion. No tenderness or pain on movement.     Comments: Mild nonerythematous swelling to the forehead just above the brow bone bilaterally.  It is not tender to palpation, patient reports some numbness to the area    Nose: Nose normal. No congestion or rhinorrhea.     Mouth/Throat:     Lips: Pink.     Mouth: Mucous membranes are moist.  Eyes:     General: Lids are normal. Vision  grossly intact.     Extraocular Movements: Extraocular movements intact.     Conjunctiva/sclera: Conjunctivae normal.     Pupils: Pupils are equal, round, and reactive to light.  Cardiovascular:     Rate and Rhythm: Normal rate and regular rhythm.  Pulmonary:     Effort: Pulmonary effort is normal.     Breath sounds: Normal breath sounds and air entry.  Neurological:     Mental Status: She is alert. Mental status is at baseline.     Cranial Nerves: Cranial nerve deficit present.     Comments:  Decreased sensation to forehead, CN 7.  No CN 7 palsy, normal facial movements.   Psychiatric:        Mood and Affect: Mood normal.        Behavior: Behavior normal.     ED Results / Procedures / Treatments   Labs (all labs ordered are listed, but only abnormal results are displayed) Labs Reviewed  RESP PANEL BY RT-PCR (FLU A&B, COVID) ARPGX2  CBC WITH DIFFERENTIAL/PLATELET  BASIC METABOLIC PANEL    EKG None  Radiology No results found.  Procedures Procedures    Medications Ordered in ED Medications  iohexol (OMNIPAQUE) 300 MG/ML solution 80 mL (80 mLs Intravenous Contrast Given 12/21/21 0013)    ED Course/ Medical Decision Making/ A&P                           Medical Decision Making Amount and/or Complexity of Data Reviewed Labs: ordered. Radiology: ordered.   This patient presents to the ED with chief complaint(s) of forehead swelling, headache, numbness to the forehead, and congestion for the past day.  Patient reports swelling has worsened since it began.  Headache is intermittent and bandlike in nature.  She has no visual changes or disturbances.  The differential diagnosis includes URI, COVID, influenza, acute sinusitis, Puffy Pott's tumor, tension headache   The initial plan is to obtain baseline labs and CT maxillofacial with contrast to rule out puffy potts tumor  Initial Assessment:   On exam, patient is sitting up in bed and appears to be comfortable, in no  acute distress.  Mild, non erythematous swelling to the forehead just above the brow bone, it is equal on both sides, nonfluctuant, nontender.  Patient reports numbness to this area.  She does not have facial nerve palsy, is able to move forehead and brows normally.  Normal sensation to cheeks and jaw.  No tenderness over temporal region or TMJ.  No obvious congestion or rhinorrhea.  EOM intact, PERRLA.  No obvious injury or trauma to the forehead or face.  Independent ECG/labs interpretation:  The following labs were independently interpreted:  CBC without evidence of leukocytosis, anemia, or abnormal platelet count. Metabolic panel shows no electrolyte disturbance.  Kidney function is normal. Respiratory panel is negative for COVID and flu.  Independent visualization and interpretation of imaging: I independently visualized the following imaging with scope of interpretation limited to determining acute life threatening conditions related to emergency care: CT maxillofacial, which at the time is still pending.  Disposition:   12:18 AM Care of patient transferred Dr. Randal Buba at the end of my shift as the patient will require reassessment once labs/imaging have resulted. Patient presentation, ED course, and plan of care discussed with review of all pertinent labs and imaging. Please see his/her note for further details regarding further ED course and disposition. Plan at time of handoff is discharge patient home with primary care follow-up should results of CT maxillofacial returned normal; if CT imaging is indicative of acute sinusitis, plan is to treat with supportive care measures including OTC decongestions and intranasal decongestions due to short duration of symptoms. This may be altered or completely changed at the discretion of the oncoming team pending results of further workup.           Final Clinical Impression(s) / ED Diagnoses Final diagnoses:  Acute non-recurrent sinusitis,  unspecified location    Rx / DC Orders ED Discharge Orders     None  Theressa Stamps R, [place] 12/21/21 0018    Elgie Congo, MD 12/21/21 1136

## 2021-12-20 NOTE — ED Provider Notes (Incomplete)
[facility] Provider Note   CSN: [id number] Arrival date & time: 12/20/21  1651     History {Add pertinent medical, surgical, social history, OB history to HPI:1} Chief Complaint  Patient presents with  . Edema    Rebecca Gentry is a 57 y.o. female presents to the ED complaining of edema to her forehead, nausea, and associated headache.  She also reports "a little bit of congestion", postnasal drip, and some numbness to her forehead.  Patient reports she has not had symptoms like this in the past.  She describes her headache as intermittent and bandlike around both sides of the head.  She reports that the swelling over her forehead has worsened since yesterday.  Denies pain with the swelling.  Denies fever, sinus pain, sinus pressure, sore throat, vomiting, syncope, weakness, eye pain, eye discharge.       Home Medications Prior to Admission medications   Medication Sig Start Date End Date Taking? Authorizing Provider  Albuterol Sulfate (PROAIR RESPICLICK) 177 (90 Base) MCG/ACT AEPB Inhale 1 puff into the lungs every 4 (four) hours as needed (sob).    [provider]  amLODipine (NORVASC) 5 MG tablet Take 5 mg by mouth daily as needed (chest pain).    [provider]  Ascorbic Acid (VITAMIN C) 1000 MG tablet Take 1,000 mg by mouth daily.    [provider]  aspirin 325 MG tablet Take 650 mg by mouth every 4 (four) hours as needed for mild pain or headache. Patient not taking: No sig reported    [provider]  BELSOMRA 10 MG TABS Take 1 tablet by mouth at bedtime as needed. 10/29/20   [provider]  BROMELAINS PO Take 1 tablet by mouth daily.    [provider]  calcium carbonate (OS-CAL) 1250 (500 Ca) MG chewable tablet Chew 1 tablet by mouth 2 (two) times daily.    [provider]  cetirizine (ZYRTEC) 10 MG tablet Take 10 mg by mouth daily.    [provider]  desipramine  (NORPRAMIN) 25 MG tablet Take 25 mg by mouth daily.    [provider]  diclofenac Sodium (VOLTAREN) 1 % GEL Apply topically. 08/28/20   [provider]  dicyclomine (BENTYL) 20 MG tablet Take by mouth. 11/19/18   [provider]  fluticasone (FLONASE) 50 MCG/ACT nasal spray Place 1 spray into both nostrils 2 (two) times daily.    [provider]  ibuprofen (ADVIL) 800 MG tablet Take 800 mg by mouth 3 (three) times daily. 09/17/20   [provider]  loperamide (IMODIUM) 2 MG capsule Take 1 capsule (2 mg total) by mouth as needed for diarrhea or loose stools. 10/19/21   Redwine, Madison A, PA-C  Magnesium 200 MG TABS Take 200 mg by mouth daily.    [provider]  meclizine (ANTIVERT) 25 MG tablet Take 25 mg by mouth as needed for dizziness.    [provider]  methocarbamol (ROBAXIN) 500 MG tablet Take 1 tablet (500 mg total) by mouth every 8 (eight) hours as needed for muscle spasms. 10/11/20   Davonna Belling, MD  mometasone-formoterol (DULERA) 100-5 MCG/ACT AERO Inhale 2 puffs into the lungs 2 (two) times daily.    [provider]  montelukast (SINGULAIR) 10 MG tablet Take 10 mg by mouth daily. 10/25/20   [provider]  Multiple Vitamin (MULTIVITAMIN WITH MINERALS) TABS tablet Take 1 tablet by mouth daily.    [provider]  naltrexone (DEPADE) 50 MG tablet Take 75 mg by mouth daily.    [provider]  nebivolol (BYSTOLIC) 5 MG tablet Take 5 mg by mouth daily.    [provider]  omeprazole (PRILOSEC) 20 MG capsule Take 1-2 capsules (20-40 mg total) by mouth daily as needed. 01/10/19   Armbruster, Carlota Raspberry, MD  ondansetron (ZOFRAN) 4 MG tablet Take 1 tablet (4 mg total) by mouth every 6 (six) hours. 02/29/20   Gareth Morgan, MD  ondansetron (ZOFRAN-ODT) 4 MG disintegrating tablet Take 1 tablet (4 mg total) by mouth every 8 (eight) hours as needed for nausea or vomiting. 10/19/21   Redwine,  Madison A, PA-C  polyvinyl alcohol (LIQUIFILM TEARS) 1.4 % ophthalmic solution Place 1 drop into both eyes as needed for dry eyes.    [provider]  Potassium 99 MG TABS Take 99 mg by mouth daily. Patient not taking: No sig reported    [provider]  Probiotic Product (ALIGN) 4 MG CAPS Take 1 capsule by mouth daily.    [provider]  Turmeric (QC TUMERIC COMPLEX PO) Take 300 mg by mouth daily.    [provider]  vitamin B-12 (CYANOCOBALAMIN) 1000 MCG tablet Take 3,000 mcg by mouth daily.    [provider]  Vitamin D, Cholecalciferol, 10 MCG (400 UNIT) CAPS Take 1 capsule by mouth daily.    [provider]      Allergies    Penicillin g, Apple juice, and Other    Review of Systems   Review of Systems  Constitutional:  Negative for chills and fever.  HENT:  Positive for congestion, facial swelling and postnasal drip. Negative for sinus pressure, sinus pain and sore throat.   Eyes:  Negative for pain and discharge.  Gastrointestinal:  Positive for nausea. Negative for vomiting.  Neurological:  Positive for numbness and headaches. Negative for syncope and weakness.    Physical Exam Updated Vital Signs BP 110/61   Pulse (!) 49   Temp 98.4 F (36.9 C) (Oral)   Resp 18   Ht '5\' 6"'$  (1.676 m)   Wt 68 kg   SpO2 100%   BMI 24.21 kg/m  Physical Exam Vitals and nursing note reviewed.  Constitutional:      General: She is not in acute distress.    Appearance: Normal appearance. She is not ill-appearing or diaphoretic.  HENT:     Head: Atraumatic. No contusion or masses.     Jaw: There is normal jaw occlusion. No tenderness or pain on movement.     Comments: Mild nonerythematous swelling to the forehead just above the brow bone bilaterally.  It is not tender to palpation, patient reports some numbness to the area    Nose: Nose normal. No congestion or rhinorrhea.     Mouth/Throat:     Lips: Pink.     Mouth: Mucous membranes  are moist.  Eyes:     General: Lids are normal. Vision grossly intact.     Extraocular Movements: Extraocular movements intact.     Conjunctiva/sclera: Conjunctivae normal.     Pupils: Pupils are equal, round, and reactive to light.  Cardiovascular:     Rate and Rhythm: Normal rate and regular rhythm.  Pulmonary:     Effort: Pulmonary effort is normal.     Breath sounds: Normal breath sounds and air entry.  Neurological:     Mental Status: She is alert. Mental status is at baseline.     Cranial Nerves:  Cranial nerve deficit present.     Comments: Decreased sensation to forehead, CN 7.  No CN 7 palsy, normal facial movements.   Psychiatric:        Mood and Affect: Mood normal.        Behavior: Behavior normal.     ED Results / Procedures / Treatments   Labs (all labs ordered are listed, but only abnormal results are displayed) Labs Reviewed  RESP PANEL BY RT-PCR (FLU A&B, COVID) ARPGX2  CBC WITH DIFFERENTIAL/PLATELET  BASIC METABOLIC PANEL    EKG None  Radiology No results found.  Procedures Procedures  {Document cardiac monitor, telemetry assessment procedure when appropriate:1}  Medications Ordered in ED Medications - No data to display  ED Course/ Medical Decision Making/ A&P                           Medical Decision Making Amount and/or Complexity of Data Reviewed Labs: ordered. Radiology: ordered.   This patient presents to the ED with chief complaint(s) of forehead swelling, headache, numbness to the forehead, and congestion for the past day.  Patient reports swelling has worsened since it began.  Headache is intermittent and bandlike in nature.  She has no visual changes or disturbances.  The differential diagnosis includes URI, COVID, influenza, sinusitis, Puffy Pott's tumor, tension headache   The initial plan is to ***  Additional history obtained: Additional history obtained from {additional history:26846} Records reviewed  {records:26847}  Initial Assessment:   ***  Independent ECG/labs interpretation:  The following labs were independently interpreted:  ***  Independent visualization and interpretation of imaging: I independently visualized the following imaging with scope of interpretation limited to determining acute life threatening conditions related to emergency care: ***, which revealed ***  Treatment and Reassessment: ***  Other treatment options considered:   ***  Disposition:   ***  Social Determinants of Health:   Patient's {IHKV:42595}  increases the complexity of managing their presentation   {Document critical care time when appropriate:1} {Document review of labs and clinical decision tools ie heart score, Chads2Vasc2 etc:1}  {Document your independent review of radiology images, and any outside records:1} {Document your discussion with family members, caretakers, and with consultants:1} {Document social determinants of health affecting pt's care:1} {Document your decision making why or why not admission, treatments were needed:1} Final Clinical Impression(s) / ED Diagnoses Final diagnoses:  None    Rx / DC Orders ED Discharge Orders     None

## 2021-12-20 NOTE — ED Triage Notes (Signed)
Reports forehead edema x 1 day , nausea today . Headache , "a little a bit of congestion" she reports .

## 2021-12-21 ENCOUNTER — Emergency Department (HOSPITAL_BASED_OUTPATIENT_CLINIC_OR_DEPARTMENT_OTHER): Payer: BC Managed Care – PPO

## 2021-12-21 ENCOUNTER — Encounter (HOSPITAL_BASED_OUTPATIENT_CLINIC_OR_DEPARTMENT_OTHER): Payer: Self-pay

## 2021-12-21 MED ORDER — CLINDAMYCIN HCL 300 MG PO CAPS: 300.0000 mg | ORAL_CAPSULE | Freq: Three times a day (TID) | ORAL | 0 refills | Status: DC

## 2021-12-21 MED ORDER — IOHEXOL 300 MG/ML  SOLN
80.0000 mL | Freq: Once | INTRAMUSCULAR | Status: AC | PRN
  Administered 2021-12-21: 80 mL via INTRAVENOUS

## 2021-12-21 NOTE — Discharge Instructions (Addendum)
Thank you for allowing me to be part of your care today.  Your CT imaging was reassuring as well as your laboratory workup.  I recommend following up with your primary care provider should your symptoms persist.

## 2022-06-18 ENCOUNTER — Encounter (HOSPITAL_COMMUNITY): Payer: Self-pay

## 2022-06-18 ENCOUNTER — Emergency Department (HOSPITAL_COMMUNITY)
Admission: EM | Admit: 2022-06-18 | Discharge: 2022-06-18 | Disposition: A | Discharge status: Home / Self Care | Payer: BC Managed Care – PPO | Attending: Emergency Medicine | Admitting: Emergency Medicine

## 2022-06-18 DIAGNOSIS — R21 Rash and other nonspecific skin eruption: Secondary | ICD-10-CM | POA: Diagnosis not present

## 2022-06-18 DIAGNOSIS — R519 Headache, unspecified: Secondary | ICD-10-CM | POA: Insufficient documentation

## 2022-06-18 DIAGNOSIS — G44209 Tension-type headache, unspecified, not intractable: Secondary | ICD-10-CM

## 2022-06-18 MED ORDER — SODIUM CHLORIDE 0.9 % IV SOLN: INTRAVENOUS | Status: DC

## 2022-06-18 MED ORDER — PROCHLORPERAZINE EDISYLATE 10 MG/2ML IJ SOLN
10.0000 mg | Freq: Once | INTRAMUSCULAR | Status: AC
  Administered 2022-06-18: 10 mg via INTRAVENOUS
  Filled 2022-06-18: qty 2

## 2022-06-18 MED ORDER — DIPHENHYDRAMINE HCL 50 MG/ML IJ SOLN
12.5000 mg | Freq: Once | INTRAMUSCULAR | Status: AC
  Administered 2022-06-18: 12.5 mg via INTRAVENOUS
  Filled 2022-06-18: qty 1

## 2022-06-18 NOTE — ED Triage Notes (Signed)
Pt presents with c/o headache and rash. Pt reports she was exposed to hand, foot, and mouth last week and now has some bumps on her face, her legs, and her arms. Pt also reports a headache and just overall muscle fatigue.

## 2022-06-18 NOTE — ED Provider Notes (Signed)
[place] EMERGENCY DEPARTMENT AT [facility] Provider Note   CSN: [id number] Arrival date & time: 06/18/22  1533     History  Chief Complaint  Patient presents with   Headache   Rash    Rebecca Gentry is a 57 y.o. female who presents with 1 day of a rash and headache. 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 She states the rash appeared on her face, arms, and legs. She denies any rash on her hands or feet. The rash is itchy. She denies any changes in soaps or lotions, denies any new medications, possible exposure to ticks, fever, chills. The headache is a band-like headache at a 7/10 intensity. She reports a history of headaches, but feels this is slightly different than her normal headaches which usually only involve one side of her head. She denies hitting her head, falls, changes in vision, dizziness, or neck pain outside of her normal fibromyalgia pain. She has taken aspirin at home which has not helped with the pain.    Headache Rash Associated symptoms: headaches        Home Medications Prior to Admission medications   Medication Sig Start Date End Date Taking? Authorizing Provider  Albuterol Sulfate (PROAIR RESPICLICK) 108 (90 Base) MCG/ACT AEPB Inhale 1 puff into the lungs every 4 (four) hours as needed (sob).    [provider]  amLODipine (NORVASC) 5 MG tablet Take 5 mg by mouth daily as needed (chest pain).    [provider]  Ascorbic Acid (VITAMIN C) 1000 MG tablet Take 1,000 mg by mouth daily.    [provider]  aspirin 325 MG tablet Take 650 mg by mouth every 4 (four) hours as needed for mild pain or headache. Patient not taking: No sig reported    [provider]  BELSOMRA 10 MG TABS Take 1 tablet by mouth at bedtime as needed. 10/29/20   [provider]  BROMELAINS PO Take 1 tablet by mouth daily.    [provider]  calcium  carbonate (OS-CAL) 1250 (500 Ca) MG chewable tablet Chew 1 tablet by mouth 2 (two) times daily.    [provider]  cetirizine (ZYRTEC) 10 MG tablet Take 10 mg by mouth daily.    [provider]  clindamycin (CLEOCIN) 300 MG capsule Take 1 capsule (300 mg total) by mouth 3 (three) times daily. X 7 days 12/21/21   Palumbo, April, MD  desipramine (NORPRAMIN) 25 MG tablet Take 25 mg by mouth daily.    [provider]  diclofenac Sodium (VOLTAREN) 1 % GEL Apply topically. 08/28/20   [provider]  dicyclomine (BENTYL) 20 MG tablet Take by mouth. 11/19/18   [provider]  fluticasone (FLONASE) 50 MCG/ACT nasal spray Place 1 spray into both nostrils 2 (two) times daily.    [provider]  ibuprofen (ADVIL) 800 MG tablet Take 800 mg by mouth 3 (three) times daily. 09/17/20   [provider]  loperamide (IMODIUM) 2 MG capsule Take 1 capsule (2 mg total) by mouth as needed for diarrhea or loose stools. 10/19/21   Redwine, Madison A, PA-C  Magnesium 200 MG TABS Take 200 mg by mouth daily.    [provider]  meclizine (ANTIVERT) 25 MG tablet Take 25 mg by mouth as needed for dizziness.    [provider]  methocarbamol (ROBAXIN) 500 MG tablet Take 1 tablet (500  mg total) by mouth every 8 (eight) hours as needed for muscle spasms. 10/11/20   Benjiman Core, MD  mometasone-formoterol (DULERA) 100-5 MCG/ACT AERO Inhale 2 puffs into the lungs 2 (two) times daily.    [provider]  montelukast (SINGULAIR) 10 MG tablet Take 10 mg by mouth daily. 10/25/20   [provider]  Multiple Vitamin (MULTIVITAMIN WITH MINERALS) TABS tablet Take 1 tablet by mouth daily.    [provider]  naltrexone (DEPADE) 50 MG tablet Take 75 mg by mouth daily.    [provider]  nebivolol (BYSTOLIC) 5 MG tablet Take 5 mg by mouth daily.    [provider]  omeprazole (PRILOSEC) 20 MG capsule Take 1-2 capsules  (20-40 mg total) by mouth daily as needed. 01/10/19   Armbruster, Willaim Rayas, MD  ondansetron (ZOFRAN) 4 MG tablet Take 1 tablet (4 mg total) by mouth every 6 (six) hours. 02/29/20   Alvira Monday, MD  ondansetron (ZOFRAN-ODT) 4 MG disintegrating tablet Take 1 tablet (4 mg total) by mouth every 8 (eight) hours as needed for nausea or vomiting. 10/19/21   Redwine, Madison A, PA-C  polyvinyl alcohol (LIQUIFILM TEARS) 1.4 % ophthalmic solution Place 1 drop into both eyes as needed for dry eyes.    [provider]  Potassium 99 MG TABS Take 99 mg by mouth daily. Patient not taking: No sig reported    [provider]  Probiotic Product (ALIGN) 4 MG CAPS Take 1 capsule by mouth daily.    [provider]  Turmeric (QC TUMERIC COMPLEX PO) Take 300 mg by mouth daily.    [provider]  vitamin B-12 (CYANOCOBALAMIN) 1000 MCG tablet Take 3,000 mcg by mouth daily.    [provider]  Vitamin D, Cholecalciferol, 10 MCG (400 UNIT) CAPS Take 1 capsule by mouth daily.    [provider]      Allergies    Penicillin g, Apple juice, and Other    Review of Systems   Review of Systems  Skin:  Positive for rash.  Neurological:  Positive for headaches.    Physical Exam Updated Vital Signs BP (!) 148/71 (BP Location: Left Arm)   Pulse 66   Temp 98.4 F (36.9 C) (Oral)   Resp 17   SpO2 100%  Physical Exam Constitutional:      General: She is not in acute distress. HENT:     Head: Normocephalic and atraumatic.  Eyes:     Extraocular Movements: Extraocular movements intact.     Pupils: Pupils are equal, round, and reactive to light.  Cardiovascular:     Rate and Rhythm: Normal rate and regular rhythm.     Heart sounds: Normal heart sounds.  Pulmonary:     Effort: Pulmonary effort is normal.     Breath sounds: Normal breath sounds.  Musculoskeletal:     Cervical back: Full passive range of motion without pain and normal range of motion.  Skin:     General: Skin is warm and dry.     Comments: Small erythematous papules on right side of patient's face and bilaterally on the forearms  Neurological:     Mental Status: She is alert.     Cranial Nerves: No cranial nerve deficit.     Motor: No weakness.  Psychiatric:        Mood and Affect: Mood normal.        Speech: Speech normal.     ED Results / Procedures / Treatments  Labs (all labs ordered are listed, but only abnormal results are displayed) Labs Reviewed - No data to display  EKG None  Radiology No results found.  Procedures Procedures    Medications Ordered in ED Medications  diphenhydrAMINE (BENADRYL) injection 12.5 mg (has no administration in time range)  prochlorperazine (COMPAZINE) injection 10 mg (has no administration in time range)  0.9 %  sodium chloride infusion (has no administration in time range)    ED Course/ Medical Decision Making/ A&P                             Medical Decision Making Risk Prescription drug management.   This patient presents to the ED for concern of rash and headache. Presentation most consistent with possible hand-foot-mouth due to papular nature of rash and exposure at work. Other considerations include bug bites, contact dermatitis (due to itching of the rash). Headache most consistent with a tension headache due to the band like nature. No neurological symptoms or red flag symptoms. No concern for meningismus as she has no fever and no neck stiffness or pain. She reports she has not drank much water which may be contributing to the headache. Do not feel headache needs further workup at this time, will treat with medications in the ED.     Medicines ordered and prescription drug management:  I ordered medication including Benadryl and Compazine to treat her headache. Also gave IV fluids as she has not drank any water today which may be contributing to the headache.  Reevaluation of the patient after these medicines  showed that the patient improved. Patient reports headache was a 7/10 and is at a 2/10 upon discharge. I have reviewed the patients home medicines and have made adjustments as needed   Problem List / ED Course:  Headache Tension like headache with no concerning features Treated with Benadryl, Compazine and IV fluids Headache improved, now at 2/10 down from 7/10 pain level Rash  2-3 papules on the face and arms visible Discussed with patient that this is likely hand-foot-mouth due to recent exposure and no other likely explanation. Patient understands this is viral in nature and will resolve on its own with time           Final Clinical Impression(s) / ED Diagnoses Final diagnoses:  None    Rx / DC Orders ED Discharge Orders     None         Arabella Merles, PA-C 06/18/22 1951    Lonell Grandchild, MD 06/25/22 1116

## 2022-06-18 NOTE — ED Provider Notes (Signed)
  Procedures  Ultrasound ED Peripheral IV (Provider)  Date/Time: 06/18/2022 4:52 PM  Performed by: Mannie Stabile, PA-C Authorized by: Mannie Stabile, PA-C   Procedure details:    Indications: hydration     Skin Prep: chlorhexidine gluconate     Location:  Left AC   Angiocath:  20 G   Bedside Ultrasound Guided: Yes     Images: not archived     Patient tolerated procedure without complications: Yes     Dressing applied: Yes   Comments:     Attempted, got flash back then patient moved and IV displaced        Mannie Stabile, PA-C 06/18/22 1653    Lonell Grandchild, MD 06/25/22 1116

## 2022-06-18 NOTE — Discharge Instructions (Addendum)
Your rash is likely due to hand-foot-mouth disease. This is caused by a virus and will resolve with time. You are able to return to work if fever free.   You may take Benadryl at home to help with the itching   You should follow up with your PCP regarding your headaches if they become more frequent.   Return to the ER should your headache acutely worsen, you experience changes in vision, or any other concerning symptoms

## 2022-07-31 ENCOUNTER — Other Ambulatory Visit: Payer: Self-pay | Admitting: Family Medicine

## 2022-07-31 DIAGNOSIS — Z1231 Encounter for screening mammogram for malignant neoplasm of breast: Secondary | ICD-10-CM

## 2022-08-13 ENCOUNTER — Ambulatory Visit: Payer: BC Managed Care – PPO | Admitting: Gastroenterology

## 2022-08-13 ENCOUNTER — Encounter: Payer: Self-pay | Admitting: Gastroenterology

## 2022-08-13 VITALS — BP 124/78 | HR 77 | Ht 66.0 in | Wt 157.0 lb

## 2022-08-13 DIAGNOSIS — Z79899 Other long term (current) drug therapy: Secondary | ICD-10-CM

## 2022-08-13 DIAGNOSIS — K582 Mixed irritable bowel syndrome: Secondary | ICD-10-CM

## 2022-08-13 DIAGNOSIS — K219 Gastro-esophageal reflux disease without esophagitis: Secondary | ICD-10-CM | POA: Diagnosis not present

## 2022-08-13 DIAGNOSIS — R053 Chronic cough: Secondary | ICD-10-CM | POA: Diagnosis not present

## 2022-08-13 MED ORDER — CITRUCEL PO POWD: 1.0000 | Freq: Every day | ORAL | Status: AC

## 2022-08-13 MED ORDER — OMEPRAZOLE 20 MG PO CPDR: 20.0000 mg | DELAYED_RELEASE_CAPSULE | Freq: Two times a day (BID) | ORAL | 3 refills | Status: AC

## 2022-08-13 NOTE — Patient Instructions (Signed)
You have been scheduled for an endoscopy. Please follow written instructions given to you at your visit today.  If you use inhalers (even only as needed), please bring them with you on the day of your procedure.  If you take any of the following medications, they will need to be adjusted prior to your procedure:   DO NOT TAKE 7 DAYS PRIOR TO TEST- Trulicity (dulaglutide) Ozempic, Wegovy (semaglutide) Mounjaro (tirzepatide) Bydureon Bcise (exanatide extended release)  DO NOT TAKE 1 DAY PRIOR TO YOUR TEST Rybelsus (semaglutide) Adlyxin (lixisenatide) Victoza (liraglutide) Byetta (exanatide) ___________________________________________________________   Increase your omeprazole to twice a day.  We sent  a new prescription to your pharmacy.  Please purchase the following medications over the counter and take as directed: Citrucel- take once daily as directed  Thank you for entrusting me with your care and for choosing [facility], Dr. Ileene Patrick   If your blood pressure at your visit was 140/90 or greater, please contact your primary care physician to follow up on this. ______________________________________________________  If you are age 68 or older, your body mass index should be between 23-30. Your Body mass index is 25.34 kg/m. If this is out of the aforementioned range listed, please consider follow up with your Primary Care Provider.  If you are age 79 or younger, your body mass index should be between 19-25. Your Body mass index is 25.34 kg/m. If this is out of the aformentioned range listed, please consider follow up with your Primary Care Provider.  ________________________________________________________  The [facility] [facility] providers would like to encourage you to use [facility] to communicate with providers for non-urgent requests or questions.  Due to long hold times on the telephone, sending your provider a message by [facility] may be a faster and more efficient  way to get a response.  Please allow 48 business hours for a response.  Please remember that this is for non-urgent requests.  _______________________________________________________  Due to recent changes in healthcare laws, you may see the results of your imaging and laboratory studies on MyChart before your provider has had a chance to review them.  We understand that in some cases there may be results that are confusing or concerning to you. Not all laboratory results come back in the same time frame and the provider may be waiting for multiple results in order to interpret others.  Please give [place] 48 hours in order for your provider to thoroughly review all the results before contacting the office for clarification of your results.

## 2022-08-13 NOTE — Progress Notes (Signed)
HPI :  57 year old female here for follow-up visit for GERD, chronic cough, history of IBS.  I last saw her in October 2022 for colonoscopy.  Recall she has had reflux for many years, previously she was on omeprazole 40 mg once daily which worked pretty well for her.  Over time she has titrated down to lower dose of 20 mg once daily.  She states she takes it every day, however can have some breakthrough reflux symptoms fairly frequently that can bother her.  She is not having any dysphagia.  Has occasional nausea and very rare vomiting.  No routine abdominal pains otherwise.  [place] food can trigger her symptoms.  She denies much of any nocturnal symptoms of bother her.  Recall she had an EGD in 2017 in [place] state, no report obtained, she reports a history of a hiatal hernia and no Barrett's.  She follows with [facility] for asthma.  She takes her Dulera and albuterol inhalers routinely, denies any wheezing, however she has been bothered with chronic cough for several months now.  She feels that she frequently needs to clear her throat, this bothers her multiple days per week.  Her asthma doctor was concerned that GERD could be making her asthma worse or leading to this chronic cough.  She does have sleep apnea.  Otherwise denies any chest pain or shortness of breath or other cardiopulmonary symptoms.  She has been on PPI for some time and does endorse a history of osteopenia.  No bone fractures.  No CKD, kidney function normal.  Otherwise recall she has had a few colonoscopies as outlined below.  She has occasional loose stools and occasional constipation.  She states she goes back and forth between the 2 at times.  Can use Imodium as needed if diarrhea gets bad.  She has not really been taking anything for this lately.  No blood in her stools.     CT abdomen / pelvis - 11/19/18 - IMPRESSION: Bowel wall thickening and inflammatory changes throughout the mid to distal descending colon.  Findings most likely represent colitis of infectious or inflammatory etiology.   FOBT (+) 11/19/18 Other labs normal  Colonoscopy 01/19/19: - The perianal and digital rectal examinations were normal. - A moderate amount of semi-liquid stool was found in the entire colon, making visualization difficult. Lavage of the area was performed using copious amounts of sterile water, resulting in incomplete clearance with fair visualization. The cecum could not be cleared, the colonoscope clogged due to residual seeds / nuts. - A diminutive polyp was found in the ascending colon. The polyp was sessile. The polyp was removed with a cold snare. Resection and retrieval were complete. - A few small-mouthed diverticula were found in the sigmoid colon. - Anal papilla(e) were hypertrophied. - The exam was otherwise without abnormality. Specifically, no inflammatory changes of the left colon appreciated in regards to CT scan. No evidence of IBD.  Surgical [P], colon, ascending, polyp - PLANT MATERIAL CONSISTENT WITH FOOD MATTER. - NO COLONIC MUCOSA, ADENOMATOUS CHANGE OR MALIGNANCY.   Colonoscopy 11/07/20: - The perianal and digital rectal examinations were normal. - Scattered small-mouthed diverticula were found in the sigmoid colon and transverse colon. - Anal papilla(e) were hypertrophied. - The exam was otherwise without abnormality.  Repeat colonoscopy in 10 years   Past Medical History:  Diagnosis Date   Allergy    Anal fissure    Anemia    Anxiety    Arthritis    Asthma  Depression    Diverticulosis    Fibromyalgia    GERD (gastroesophageal reflux disease)    HTN (hypertension)    Irritable bowel syndrome    Sinus arrhythmia    Sleep apnea      Past Surgical History:  Procedure Laterality Date   ABDOMINAL HYSTERECTOMY     partial   COLONOSCOPY     LEEP     UPPER GASTROINTESTINAL ENDOSCOPY  2015   Family History  Problem Relation Age of Onset   Colon polyps Mother    Breast  cancer Mother    Heart disease Mother    Irritable bowel syndrome Mother    Kidney disease Mother    Other Father        bowel obstruction/ sepis   Lung cancer Brother        smoker   Breast cancer Paternal Aunt    Esophageal cancer Neg Hx    Colon cancer Neg Hx    Rectal cancer Neg Hx    Stomach cancer Neg Hx    Social History   Tobacco Use   Smoking status: Former    Current packs/day: 0.00    Types: Cigarettes    Quit date: 1995    Years since quitting: 29.6   Smokeless tobacco: Never  Vaping Use   Vaping status: Never Used  Substance Use Topics   Alcohol use: Never   Drug use: Never   Current Outpatient Medications  Medication Sig Dispense Refill   albuterol (VENTOLIN HFA) 108 (90 Base) MCG/ACT inhaler Inhale 1 puff into the lungs daily as needed.     Ascorbic Acid (VITAMIN C) 1000 MG tablet Take 1,000 mg by mouth daily.     aspirin 325 MG tablet Take 650 mg by mouth every 4 (four) hours as needed for mild pain or headache.     BELSOMRA 10 MG TABS Take 1 tablet by mouth at bedtime as needed.     calcium carbonate (OS-CAL) 1250 (500 Ca) MG chewable tablet Chew 1 tablet by mouth 2 (two) times daily.     desipramine (NORPRAMIN) 25 MG tablet Take 25 mg by mouth daily.     diclofenac Sodium (VOLTAREN) 1 % GEL Apply topically.     fluticasone (FLONASE) 50 MCG/ACT nasal spray Place 1 spray into both nostrils 2 (two) times daily.     ibuprofen (ADVIL) 800 MG tablet Take 800 mg by mouth 3 (three) times daily.     loperamide (IMODIUM) 2 MG capsule Take 1 capsule (2 mg total) by mouth as needed for diarrhea or loose stools. 30 capsule 0   Magnesium 200 MG TABS Take 200 mg by mouth daily.     methocarbamol (ROBAXIN) 500 MG tablet Take 1 tablet (500 mg total) by mouth every 8 (eight) hours as needed for muscle spasms. 8 tablet 0   mometasone-formoterol (DULERA) 100-5 MCG/ACT AERO Inhale 2 puffs into the lungs 2 (two) times daily.     Multiple Vitamin (MULTIVITAMIN WITH MINERALS)  TABS tablet Take 1 tablet by mouth daily.     nebivolol (BYSTOLIC) 5 MG tablet Take 5 mg by mouth daily.     omeprazole (PRILOSEC) 20 MG capsule Take 20 mg by mouth daily.     OVER THE COUNTER MEDICATION NutriDyn 80mg  : Take one capsule twice a day     polyvinyl alcohol (LIQUIFILM TEARS) 1.4 % ophthalmic solution Place 1 drop into both eyes as needed for dry eyes.     Probiotic Product (ALIGN) 4 MG  CAPS Take 1 capsule by mouth daily.     rosuvastatin (CRESTOR) 10 MG tablet Take 10 mg by mouth at bedtime.     No current facility-administered medications for this visit.   Allergies  Allergen Reactions   Penicillin G     Other reaction(s): Other (See Comments) hives   Apple Juice Swelling    It is just APPLES NOT APPLE JUICE THAT CAUSES THIS   Other     Ingredient in Sun screen- She uses Baby Sunscreen with no issues      Review of Systems: All systems reviewed and negative except where noted in HPI.   Lab Results  Component Value Date   WBC 7.3 12/20/2021   HGB 13.0 12/20/2021   HCT 40.0 12/20/2021   MCV 84.4 12/20/2021   PLT 286 12/20/2021    Lab Results  Component Value Date   NA 139 12/20/2021   CL 107 12/20/2021   K 3.6 12/20/2021   CO2 27 12/20/2021   BUN 12 12/20/2021   CREATININE 0.80 12/20/2021   GFRNONAA >60 12/20/2021   CALCIUM 9.3 12/20/2021   ALBUMIN 3.9 10/19/2021   GLUCOSE 93 12/20/2021    Lab Results  Component Value Date   ALT 18 10/19/2021   AST 17 10/19/2021   ALKPHOS 63 10/19/2021   BILITOT 0.4 10/19/2021    Physical Exam: BP 124/78   Pulse 77   Ht 5\' 6"  (1.676 m)   Wt 157 lb (71.2 kg)   BMI 25.34 kg/m  Constitutional: Pleasant,well-developed, female in no acute distress. Neurological: Alert and oriented to person place and time. Skin: Skin is warm and dry. No rashes noted. Psychiatric: Normal mood and affect. Behavior is normal.   ASSESSMENT: 57 y.o. female here for assessment of the following  1. Gastroesophageal reflux  disease, unspecified whether esophagitis present   2. Chronic cough   3. Long-term current use of proton pump inhibitor therapy   4. Irritable bowel syndrome with both constipation and diarrhea    As above, symptoms of chronic cough for several months now in the setting of some breakthrough reflux symptoms.  Cough could certainly be related to her reflux, or reflux could be making asthma worse.  We discussed options.  She has been on PPI for some time, we were able to lower the dose but it sounds like she is having some breakthrough symptoms with this.  At this point I think reasonable to increase her omeprazole back to 20 mg twice daily, take half hour prior to meal for 1 month trial to see if this helps.  We did discuss long-term risks of chronic PPI therapy to include CKD, increased risk for bone fracture, C. difficile etc.  She does have osteopenia, no history of fractures but must be mindful of this while on PPI.  Otherwise we discussed ways to evaluate this.  She does have a history of hiatal hernia remotely.  I offered her an EGD given her breakthrough symptoms and cough to further evaluate.  We discussed what this is, risks and benefits of the exam and anesthesia and she wants to proceed.  If this finds clear pathology or large hiatal hernia etc., we will address it.  If the exam is normal, then we may need to consider a 24-hour pH impedance test to assess for nonacid reflux and correlation of reflux to her cough, if her cough persists despite escalation of PPI  Otherwise reviewed her bowel history for bed.  Recommend she try Citrucel once  daily to provide some regularity.  She has had some bloating with Metamucil in the past, hopefully she tolerates Citrucel better.  Colonoscopy up-to-date  PLAN: - increase omeprazole to 20mg  BID for 4-6 week trial - schedule EGD in the [facility] - may need 24 hour pH impedance study pending her course and EGD findings - recommend Citrucel once daily to provide  bowel regularity  Harlin Rain, MD [facility]

## 2022-09-06 ENCOUNTER — Encounter: Payer: Self-pay | Admitting: Certified Registered Nurse Anesthetist

## 2022-09-11 ENCOUNTER — Encounter: Payer: Self-pay | Admitting: Gastroenterology

## 2022-09-11 ENCOUNTER — Ambulatory Visit (AMBULATORY_SURGERY_CENTER): Payer: BC Managed Care – PPO | Admitting: Gastroenterology

## 2022-09-11 VITALS — BP 129/67 | HR 71 | Temp 97.3°F | Resp 12 | Ht 66.0 in | Wt 157.0 lb

## 2022-09-11 DIAGNOSIS — K317 Polyp of stomach and duodenum: Secondary | ICD-10-CM

## 2022-09-11 DIAGNOSIS — R053 Chronic cough: Secondary | ICD-10-CM

## 2022-09-11 DIAGNOSIS — K219 Gastro-esophageal reflux disease without esophagitis: Secondary | ICD-10-CM

## 2022-09-11 MED ORDER — SODIUM CHLORIDE 0.9 % IV SOLN: 500.0000 mL | INTRAVENOUS | Status: DC

## 2022-09-11 NOTE — Op Note (Signed)
[facility] Patient Name: Rebecca Gentry Procedure Date: 09/11/2022 10:10 AM MRN: [id number] Endoscopist: Viviann Spare P. Adela Lank , MD, [id number] Age: 57 Referring MD:  Date of Birth: Feb 20, 1965 Gender: Female Account #: [id number] Procedure:                Upper GI endoscopy Indications:              historyof gastro-esophageal reflux disease, Chronic                            cough - symptoms on omeprazole 20mg  daily. Dose was                            increased to 20mg  twice daily in the office leading                            to better control of both reflux symptoms and cough Medicines:                Monitored Anesthesia Care Procedure:                Pre-Anesthesia Assessment:                           - Prior to the procedure, a History and Physical                            was performed, and patient medications and                            allergies were reviewed. The patient's tolerance of                            previous anesthesia was also reviewed. The risks                            and benefits of the procedure and the sedation                            options and risks were discussed with the patient.                            All questions were answered, and informed consent                            was obtained. Prior Anticoagulants: The patient has                            taken no anticoagulant or antiplatelet agents. ASA                            Grade Assessment: II - A patient with mild systemic                            disease. After reviewing the risks and benefits,  the patient was deemed in satisfactory condition to                            undergo the procedure.                           After obtaining informed consent, the endoscope was                            passed under direct vision. Throughout the                            procedure, the patient's blood pressure, pulse, and                             oxygen saturations were monitored continuously. The                            Olympus Scope [id number] was introduced through the                            mouth, and advanced to the second part of duodenum.                            The upper GI endoscopy was accomplished without                            difficulty. The patient tolerated the procedure                            well. Scope In: Scope Out: Findings:                 Esophagogastric landmarks were identified: the                            Z-line was found at 38 cm, the gastroesophageal                            junction was found at 38 cm and the upper extent of                            the gastric folds was found at 39 cm from the                            incisors.                           A 1 cm hiatal hernia was present.                           The gastroesophageal flap valve was visualized                            endoscopically and classified as Hill Grade IV (  no                            fold, wide open lumen, hiatal hernia present).                           There was one small gastric inlet patch in the                            proximal esophagus. The exam of the esophagus was                            otherwise normal. No Barrett's or erosive changes.                           Multiple small sessile polyps were found in the                            gastric fundus and in the gastric body. Suspect                            benign fundic gland polyps. Biopsies were taken                            with a cold forceps for histology.                           The exam of the stomach was otherwise normal.                           A large diverticulum was found in the second                            portion of the duodenum.                           The exam of the duodenum was otherwise normal. Complications:            No immediate complications. Estimated blood loss:                             Minimal. Estimated Blood Loss:     Estimated blood loss was minimal. Impression:               - Esophagogastric landmarks identified.                           - 1 cm hiatal hernia.                           - Gastroesophageal flap valve classified as Hill                            Grade IV (no fold, wide open lumen, hiatal hernia  present).                           - Benign proximal gastric inlet patch.                           - Normal esophagus otherwise - no Barrett's.                           - Multiple gastric polyps. Suspect benign fundic                            gland polyps. Biopsied.                           - Normal stomach otherwise.                           - Duodenal diverticulum.                           - Normal duodenum otherwise.                           Overall given favorable response to higher dose                            omeprazole, suspect GERD is contributing to cough.                            Continue omeprazole at present dose, long term use                            the lowest daily dose needed to control symptoms. Recommendation:           - Patient has a contact number available for                            emergencies. The signs and symptoms of potential                            delayed complications were discussed with the                            patient. Return to normal activities tomorrow.                            Written discharge instructions were provided to the                            patient.                           - Resume previous diet.                           - Continue present medications.                           -  Await pathology results. Viviann Spare P. Adela Lank, MD 09/11/2022 10:34:45 AM This report has been signed electronically.

## 2022-09-11 NOTE — Progress Notes (Signed)
Patient states there have been no changes to medical or surgical history since time of pre-visit. 

## 2022-09-11 NOTE — Progress Notes (Signed)
Report given to PACU, vss 

## 2022-09-11 NOTE — Progress Notes (Signed)
Called to room to assist during endoscopic procedure.  Patient ID and intended procedure confirmed with present staff. Received instructions for my participation in the procedure from the performing physician.  

## 2022-09-11 NOTE — Progress Notes (Signed)
1018 Robinul 0.1 mg IV given due large amount of secretions upon assessment.  MD made aware, vss

## 2022-09-11 NOTE — Patient Instructions (Signed)
Resume previous diet and medications. Awaiting pathology results.  YOU HAD AN ENDOSCOPIC PROCEDURE TODAY AT [facility]:   Refer to the procedure report that was given to you for any specific questions about what was found during the examination.  If the procedure report does not answer your questions, please call your gastroenterologist to clarify.  If you requested that your care partner not be given the details of your procedure findings, then the procedure report has been included in a sealed envelope for you to review at your convenience later.  YOU SHOULD EXPECT: Some feelings of bloating in the abdomen. Passage of more gas than usual.  Walking can help get rid of the air that was put into your GI tract during the procedure and reduce the bloating. If you had a lower endoscopy (such as a colonoscopy or flexible sigmoidoscopy) you may notice spotting of blood in your stool or on the toilet paper. If you underwent a bowel prep for your procedure, you may not have a normal bowel movement for a few days.  Please Note:  You might notice some irritation and congestion in your nose or some drainage.  This is from the oxygen used during your procedure.  There is no need for concern and it should clear up in a day or so.  SYMPTOMS TO REPORT IMMEDIATELY:  Following upper endoscopy (EGD)  Vomiting of blood or coffee ground material  New chest pain or pain under the shoulder blades  Painful or persistently difficult swallowing  New shortness of breath  Fever of 100F or higher  Black, tarry-looking stools  For urgent or emergent issues, a gastroenterologist can be reached at any hour by calling [phone]. Do not use MyChart messaging for urgent concerns.    DIET:  We do recommend a small meal at first, but then you may proceed to your regular diet.  Drink plenty of fluids but you should avoid alcoholic beverages for 24 hours.  ACTIVITY:  You should plan to take it easy for the  rest of today and you should NOT DRIVE or use heavy machinery until tomorrow (because of the sedation medicines used during the test).    FOLLOW UP: Our staff will call the number listed on your records the next business day following your procedure.  We will call around 7:15- 8:00 am to check on you and address any questions or concerns that you may have regarding the information given to you following your procedure. If we do not reach you, we will leave a message.     If any biopsies were taken you will be contacted by phone or by letter within the next 1-3 weeks.  Please call us at [phone] if you have not heard about the biopsies in 3 weeks.    SIGNATURES/CONFIDENTIALITY: You and/or your care partner have signed paperwork which will be entered into your electronic medical record.  These signatures attest to the fact that that the information above on your After Visit Summary has been reviewed and is understood.  Full responsibility of the confidentiality of this discharge information lies with you and/or your care-partner.

## 2022-09-11 NOTE — Progress Notes (Signed)
History and Physical Interval Note: see note from 08/13/22 for details - no interval changes. Chronic GERD - on omeprazole 20mg  was poorly controlled. Increased to 20mg  BID at office visit. Also with chronic cough, unclear if GERD is contributing. EGD to further evaluate. Since office visit both GERD symptoms and cough have improved on omeprazole 20mg  BID. Otherwise feels well without complaints today.   09/11/2022 10:12 AM  Rebecca Gentry  has presented today for endoscopic procedure(s), with the diagnosis of  Encounter Diagnoses  Name Primary?   Gastroesophageal reflux disease, unspecified whether esophagitis present Yes   Chronic cough   .  The various methods of evaluation and treatment have been discussed with the patient and/or family. After consideration of risks, benefits and other options for treatment, the patient has consented to  the endoscopic procedure(s).   The patient's history has been reviewed, patient examined, no change in status, stable for surgery.  I have reviewed the patient's chart and labs.  Questions were answered to the patient's satisfaction.    Harlin Rain, MD [facility]

## 2022-09-15 ENCOUNTER — Telehealth: Payer: Self-pay

## 2022-09-15 NOTE — Telephone Encounter (Signed)
  Follow up Call-     09/11/2022    9:08 AM 11/07/2020    9:57 AM  Call back number  Post procedure Call Back phone  # [phone]  Permission to leave phone message Yes Yes     Patient questions:  Do you have a fever, pain , or abdominal swelling? No. Pain Score  0 *  Have you tolerated food without any problems? Yes.    Have you been able to return to your normal activities? Yes.    Do you have any questions about your discharge instructions: Diet   No. Medications  No. Follow up visit  No.  Do you have questions or concerns about your Care? No.  Actions: * If pain score is 4 or above: No action needed, pain <4.

## 2022-09-16 ENCOUNTER — Ambulatory Visit
Admission: RE | Admit: 2022-09-16 | Discharge: 2022-09-16 | Disposition: A | Discharge status: Home / Self Care | Payer: BC Managed Care – PPO | Source: Ambulatory Visit | Attending: Family Medicine | Admitting: Family Medicine

## 2022-09-16 DIAGNOSIS — Z1231 Encounter for screening mammogram for malignant neoplasm of breast: Secondary | ICD-10-CM

## 2022-09-22 ENCOUNTER — Encounter: Payer: Self-pay | Admitting: Gastroenterology

## 2022-11-12 ENCOUNTER — Encounter: Payer: Self-pay | Admitting: Obstetrics and Gynecology

## 2022-11-12 ENCOUNTER — Ambulatory Visit: Payer: BC Managed Care – PPO | Admitting: Obstetrics and Gynecology

## 2022-11-12 VITALS — BP 110/70 | HR 62 | Ht 65.0 in | Wt 161.0 lb

## 2022-11-12 DIAGNOSIS — R350 Frequency of micturition: Secondary | ICD-10-CM

## 2022-11-12 DIAGNOSIS — M62838 Other muscle spasm: Secondary | ICD-10-CM

## 2022-11-12 DIAGNOSIS — N898 Other specified noninflammatory disorders of vagina: Secondary | ICD-10-CM

## 2022-11-12 DIAGNOSIS — M6289 Other specified disorders of muscle: Secondary | ICD-10-CM

## 2022-11-12 DIAGNOSIS — N393 Stress incontinence (female) (male): Secondary | ICD-10-CM

## 2022-11-12 LAB — POCT URINALYSIS DIPSTICK
Bilirubin, UA: NEGATIVE
Blood, UA: NEGATIVE
Glucose, UA: NEGATIVE
Ketones, UA: NEGATIVE
Leukocytes, UA: NEGATIVE
Nitrite, UA: NEGATIVE
Protein, UA: NEGATIVE
Spec Grav, UA: 1.01 (ref 1.010–1.025)
Urobilinogen, UA: 0.2 U/dL
pH, UA: 8 (ref 5.0–8.0)

## 2022-11-12 MED ORDER — ESTRADIOL 10 MCG VA TABS
1.0000 | ORAL_TABLET | Freq: Every evening | VAGINAL | 5 refills | Status: DC
Start: 2022-11-12 — End: 2023-01-18

## 2022-11-12 MED ORDER — DIAZEPAM 5 MG PO TABS: ORAL_TABLET | ORAL | 0 refills | Status: DC

## 2022-11-12 NOTE — Progress Notes (Signed)
[facility] New Patient Evaluation and Consultation  Referring Provider: Sherian Rein, * PCP: Dois Davenport, MD Date of Service: 11/12/2022  SUBJECTIVE Chief Complaint: New Patient (Initial Visit) Rebecca Gentry is a 56 y.o. female is here for urinary incontinence)  History of Present Illness: Rebecca Gentry is a 57 y.o. White or Caucasian female seen in consultation at the request of Dr. Hinton Rao for evaluation of urge incontinence.    Review of records significant for: Has tried coconut oil for the vaginal dryness  Urinary Symptoms: Leaks urine with cough/ sneeze, laughing, lifting, and with a full bladder Leaks 1 time(s) per days.  Pad use: 1 liners/ mini-pads per day.   Patient is bothered by UI symptoms.  Day time voids 5.  Nocturia: 1 times per night to void. Voiding dysfunction:  does not empty bladder well.  Patient does not use a catheter to empty bladder.  When urinating, patient feels a weak stream and dribbling after finishing Drinks: 8-12oz Coffee AM, 12oz water, Cranberry juice/Orange juice or gingerale at dinner per day  UTIs: 0 UTI's in the last year.   Denies history of blood in urine, kidney or bladder stones, pyelonephritis, bladder cancer, and kidney cancer   Pelvic Organ Prolapse Symptoms:                  Patient Denies a feeling of a bulge the vaginal area.   Bowel Symptom: Bowel movements: 1 time(s) per day Stool consistency: soft  Straining: no.  Splinting: no.  Incomplete evacuation: no.  Patient Denies accidental bowel leakage / fecal incontinence Bowel regimen: fiber HM Colonoscopy          Colonoscopy (Every 10 Years) Next due on 11/08/2030    11/07/2020  COLONOSCOPY   Only the first 1 history entries have been loaded, but more history exists.            Sexual Function Sexually active: yes.  Sexual orientation: Straight Pain with sex: Yes, has discomfort due to dryness  Pelvic  Pain Denies pelvic pain    Past Medical History:  Past Medical History:  Diagnosis Date   Allergy    Anal fissure    Anemia    Anxiety    Arthritis    Asthma    Depression    Diverticulosis    Fibromyalgia    GERD (gastroesophageal reflux disease)    HTN (hypertension)    Hyperlipidemia 2023   Irritable bowel syndrome    Sinus arrhythmia    Sleep apnea      Past Surgical History:   Past Surgical History:  Procedure Laterality Date   ABDOMINAL HYSTERECTOMY     partial   COLONOSCOPY     LEEP     UPPER GASTROINTESTINAL ENDOSCOPY  2015     Past OB/GYN History:  G0P0000  Menopausal: Yes Contraception: N/a. Last pap smear was 09/24.  Any history of abnormal pap smears: yes. HM PAP     This patient has no relevant Health Maintenance data.       Medications: Patient has a current medication list which includes the following prescription(s): albuterol, vitamin c, aspirin, azelastine, belsomra, calcium carbonate, desipramine, diazepam, diclofenac sodium, estradiol, fexofenadine, fluticasone, ibuprofen, loperamide, magnesium, meclizine hcl, methocarbamol, citrucel, mometasone-formoterol, multivitamin with minerals, nebivolol, omeprazole, OVER THE COUNTER MEDICATION, polyvinyl alcohol, align, rosuvastatin, and tizanidine.   Allergies: Patient is allergic to penicillin g, apple juice, and other.   Social History:  Social History   Tobacco Use   Smoking  status: Former    Current packs/day: 0.00    Types: Cigarettes    Quit date: 1995    Years since quitting: 29.8   Smokeless tobacco: Never  Vaping Use   Vaping status: Never Used  Substance Use Topics   Alcohol use: Never   Drug use: Never    Relationship status: married Patient lives with spouse.   Patient is employed Transport planner. Regular exercise: Yes: eliptical and walking  History of abuse: No  Family History:   Family History  Problem Relation Age of Onset   Colon polyps Mother     Breast cancer Mother    Heart disease Mother    Irritable bowel syndrome Mother    Kidney disease Mother    Other Father        bowel obstruction/ sepis   Lung cancer Brother        smoker   Breast cancer Paternal Aunt    Esophageal cancer Neg Hx    Colon cancer Neg Hx    Rectal cancer Neg Hx    Stomach cancer Neg Hx      Review of Systems: Review of Systems  Constitutional:  Positive for malaise/fatigue. Negative for chills and fever.  Respiratory:  Positive for shortness of breath. Negative for cough.   Cardiovascular:  Negative for chest pain and palpitations.  Gastrointestinal:  Negative for abdominal pain, blood in stool, constipation and diarrhea.  Skin:  Negative for rash.  Neurological:  Negative for weakness.  Endo/Heme/Allergies:        +Hot Flashes  Psychiatric/Behavioral:  Negative for depression and suicidal ideas. The patient is nervous/anxious.      OBJECTIVE Physical Exam: Vitals:   11/12/22 1004  BP: 110/70  Pulse: 62  Weight: 161 lb (73 kg)  Height: 5\' 5"  (1.651 m)    Physical Exam Exam conducted with a chaperone present.  Constitutional:      Appearance: Normal appearance.  Pulmonary:     Effort: Pulmonary effort is normal.  Abdominal:     Palpations: Abdomen is soft.  Skin:    General: Skin is warm and dry.  Neurological:     General: No focal deficit present.     Mental Status: She is alert and oriented to person, place, and time.  Psychiatric:        Mood and Affect: Mood normal.        Behavior: Behavior normal. Behavior is cooperative.        Thought Content: Thought content normal.      GU / Detailed Urogynecologic Evaluation:  Pelvic Exam: Normal external female genitalia; Bartholin's and Skene's glands normal in appearance; urethral meatus normal in appearance, no urethral masses or discharge.   CST: positive     s/p hysterectomy: Speculum exam reveals normal vaginal mucosa with  atrophy and normal vaginal cuff.  Adnexa  normal adnexa.      Pelvic floor strength I/V  Pelvic floor musculature: Right levator tender, Right obturator tender, Left levator tender, Left obturator tender  POP-Q:   POP-Q  -3                                            Aa   -3  Ba  -6                                              C   1                                            Gh  2.5                                            Pb  7.5                                            tvl   -3                                            Ap  -3                                            Bp                                                 D      Rectal Exam:  Normal external exam  Post-Void Residual (PVR) by Bladder Scan: In order to evaluate bladder emptying, we discussed obtaining a postvoid residual and patient agreed to this procedure.  Procedure: The ultrasound unit was placed on the patient's abdomen in the suprapubic region after the patient had voided.    Post Void Residual - 11/12/22 1024       Post Void Residual   Post Void Residual 13 mL              Laboratory Results: Lab Results  Component Value Date   COLORU yellow 11/12/2022   CLARITYU clear 11/12/2022   GLUCOSEUR Negative 11/12/2022   BILIRUBINUR negative 11/12/2022   KETONESU negative 11/12/2022   SPECGRAV 1.010 11/12/2022   RBCUR negative 11/12/2022   PHUR 8.0 11/12/2022   PROTEINUR Negative 11/12/2022   UROBILINOGEN 0.2 11/12/2022   LEUKOCYTESUR Negative 11/12/2022    Lab Results  Component Value Date   CREATININE 0.80 12/20/2021   CREATININE 0.74 10/19/2021   CREATININE 0.79 10/11/2020    Lab Results  Component Value Date   HGB 13.0 12/20/2021     ASSESSMENT AND PLAN Ms. Sacchetti is a 57 y.o. with:  1. SUI (stress urinary incontinence, female)   2. Urinary frequency   3. Vaginal dryness   4. Levator spasm   5. Pelvic floor dysfunction in female    +CST on exam. We  discussed the difference between SUI and UUI and the treatment options for SUI include pessary, urethral bulking, and surgical sling. She is most  interested in urethral bulking. We discussed it would be after the first of the year to which she was agreeable.  We discussed the symptoms of overactive bladder (OAB), which include urinary urgency, urinary frequency, nocturia, with or without urge incontinence.  While we do not know the exact etiology of OAB, several treatment options exist. We discussed management including behavioral therapy (decreasing bladder irritants, urge suppression strategies, timed voids, bladder retraining), physical therapy, medication; for refractory cases posterior tibial nerve stimulation, sacral neuromodulation, and intravesical botulinum toxin injection. For anticholinergic medications, we discussed the potential side effects of anticholinergics including dry eyes, dry mouth, constipation, cognitive impairment and urinary retention. For Beta-3 agonist medication, we discussed the potential side effect of elevated blood pressure which is more likely to occur in individuals with uncontrolled hypertension. At this point patient will remain conservative and try to decrease her bladder irritants including her acidic drink consumption.  Patient has vaginal atrophy on exam. She would benefit from estrogen. She may use this nightly for 2 weeks and then twice weekly after.  Patient had significant pelvic floor muscle tension bilaterally on exam. We discussed this does play a part in pain during intercourse, difficulty achieving orgasm, and increased trouble with UUI and SUI. She is unsure if she is interested in pelvic floor PT. We discussed that I would place a referral and that she can consider her options. Referral placed today.  Referral placed for pelvic floor PT.   Patient to plan for bulking and then if needed can further address OAB/UUI symptoms.    Selmer Dominion, NP

## 2022-11-12 NOTE — Patient Instructions (Addendum)
Today we talked about ways to manage bladder urgency such as altering your diet to avoid irritative beverages and foods (bladder diet) as well as attempting to decrease stress and other exacerbating factors.    The Most Bothersome Foods* The Least Bothersome Foods*  Coffee - Regular & Decaf Tea - caffeinated Carbonated beverages - cola, non-colas, diet & caffeine-free Alcohols - Beer, Red Wine, White Wine, [address] - Grapefruit, [place], Orange, [employer] - Cranberry, Grapefruit, Orange, Pineapple Vegetables - Tomato & Tomato Products Flavor Enhancers - Hot peppers, Spicy foods, Chili, Horseradish, Vinegar, Monosodium glutamate (MSG) Artificial Sweeteners - NutraSweet, Sweet 'N Low, Equal (sweetener), Saccharin Ethnic foods - [place], [place], [place] food [employer] - low-fat & whole Fruits - Bananas, Blueberries, Honeydew melon, Pears, Raisins, Watermelon Vegetables - Broccoli, [address] Sprouts, [place], Carrots, Cauliflower, Ingenio, Cucumber, Mushrooms, Peas, Radishes, Squash, Zucchini, White potatoes, Sweet potatoes & yams Poultry - Chicken, Eggs, [place], [employer] - Beef, Diplomatic Services operational officer, Lamb Seafood - Shrimp, [place] fish, Salmon Grains - Oat, Rice Snacks - Pretzels, Popcorn  *Lenward Chancellor et al. Diet and its role in interstitial cystitis/bladder pain syndrome (IC/BPS) and comorbid conditions. [employer]. BJU Int. 2012 Jan 11.    For stress incontinence (Leakage with cough/sneeze/exercise) we discussed treatment with either a surgical sling or urethral bulking  Consider starting pelvic floor PT  For urge incontinence (leakage with rushing to the bathroom) please consider options of medication or pelvic floor PT.   For the vaginal estrogen tablets, use nightly for two weeks and after the initial 2 weeks you will use just twice a week for maintenance.

## 2022-11-16 ENCOUNTER — Encounter: Payer: Self-pay | Admitting: Obstetrics and Gynecology

## 2022-12-07 ENCOUNTER — Telehealth: Payer: Self-pay

## 2022-12-07 NOTE — Telephone Encounter (Signed)
Rebecca Gentry is a 57 y.o. female called in requesting medication prior to her urethral buking.

## 2023-01-10 ENCOUNTER — Encounter: Payer: Self-pay | Admitting: Obstetrics and Gynecology

## 2023-01-15 NOTE — Progress Notes (Signed)
 Transaction ID: [id number] PI:[email or web address] Date: 2023-01-15 Authorization Not Required Procedure Code 1 O1393 - Synthetic implnt urinary 1ml  Description Injectable bulking agent, synthetic implant, urinary tract, 1 ml syringe, includes shipping and necessary supplies  Plan Review Requirement No Prior Authorization Required  Disclaimer This code does not require review. However, inpatient stays require notification. Fax notification to [phone].

## 2023-01-18 ENCOUNTER — Other Ambulatory Visit: Payer: Self-pay

## 2023-01-18 MED ORDER — ESTRADIOL 10 MCG VA TABS: 1.0000 | ORAL_TABLET | Freq: Every evening | VAGINAL | 5 refills | Status: AC

## 2023-01-21 ENCOUNTER — Ambulatory Visit: Payer: BC Managed Care – PPO | Admitting: Obstetrics

## 2023-01-21 ENCOUNTER — Encounter: Payer: Self-pay | Admitting: Obstetrics

## 2023-01-21 VITALS — BP 123/75 | HR 69

## 2023-01-21 DIAGNOSIS — N393 Stress incontinence (female) (male): Secondary | ICD-10-CM

## 2023-01-21 DIAGNOSIS — R350 Frequency of micturition: Secondary | ICD-10-CM | POA: Diagnosis not present

## 2023-01-21 LAB — POCT URINALYSIS DIPSTICK
Bilirubin, UA: NEGATIVE
Blood, UA: NEGATIVE
Glucose, UA: NEGATIVE
Ketones, UA: NEGATIVE
Leukocytes, UA: NEGATIVE
Nitrite, UA: NEGATIVE
Protein, UA: NEGATIVE
Spec Grav, UA: 1.02 (ref 1.010–1.025)
Urobilinogen, UA: 0.2 U/dL
pH, UA: 8.5 — AB (ref 5.0–8.0)

## 2023-01-21 MED ORDER — LIDOCAINE HCL URETHRAL/MUCOSAL 2 % EX GEL
1.0000 | Freq: Once | CUTANEOUS | Status: AC
  Administered 2023-01-21: 1 via URETHRAL

## 2023-01-21 MED ORDER — LIDOCAINE-EPINEPHRINE 1 %-1:100000 IJ SOLN
8.0000 mL | Freq: Once | INTRAMUSCULAR | Status: AC
  Administered 2023-01-21: 8 mL

## 2023-01-21 NOTE — Patient Instructions (Signed)

## 2023-01-21 NOTE — Progress Notes (Signed)
 Bulkamid Injection  CC: 58 y.o. y.o. F with stress incontinence who presents for transurethral Bulkamid injection for stress urinary incontinence.  Patient signed her consent form.  She was given Cipro  for antibiotic prophylaxis today.  Today's Vitals   01/21/23 0954  BP: 123/75  Pulse: 69    Results for orders placed or performed in visit on 01/21/23 (from the past 24 hours)  POCT Urinalysis Dipstick     Status: Abnormal   Collection Time: 01/21/23 10:05 AM  Result Value Ref Range   Color, UA yellow    Clarity, UA clear    Glucose, UA Negative Negative   Bilirubin, UA Negative    Ketones, UA Negative    Spec Grav, UA 1.020 1.010 - 1.025   Blood, UA Negative    pH, UA 8.5 (A) 5.0 - 8.0   Protein, UA Negative Negative   Urobilinogen, UA 0.2 0.2 or 1.0 E.U./dL   Nitrite, UA Negative    Leukocytes, UA Negative Negative   Appearance     Odor      Procedure: Time out was performed. The bladder was catheterized and 10 ml of 2% lidocaine  jelly placed in the urethra.  A urethral block was performed by injecting 3ml of 1% lidocaine  with epinephrine  at 3 and 9 o'clock adjacent to the urethra.  Resistance was noted at the urethral meatus with passage of urethroscope under direct visualization.  Gentle serial urethral dilation was performed from 18Fr to 22Fr. The needle was primed.  The cystoscope was inserted to the level of the bladder neck without difficulty.  The needle was inserted 2 cm and the scope was pulled back into the urethra 2 cm.  The needle was inserted bevel up at the 5 o'clock position and the Bulkamid was injected to obtain coaptation.  This was repeated at the 2 o'clock,  10 o'clock and 7 o'clock positions.   A total of 2- 1ml syringes were used and good circumferential coaptation was noted.  The patient tolerated the procedure well. She was asked to void after the procedure.  Post-Void Residual (PVR) by Bladder Scan: In order to evaluate bladder emptying, we discussed  obtaining a postvoid residual and she agreed to this procedure.  Procedure: The ultrasound unit was placed on the patient's abdomen in the suprapubic region after the patient had voided.    Post Void Residual - 01/21/23 1107       Post Void Residual   Post Void Residual 40 mL             Results for orders placed or performed in visit on 01/21/23 (from the past 24 hours)  POCT Urinalysis Dipstick     Status: Abnormal   Collection Time: 01/21/23 10:05 AM  Result Value Ref Range   Color, UA yellow    Clarity, UA clear    Glucose, UA Negative Negative   Bilirubin, UA Negative    Ketones, UA Negative    Spec Grav, UA 1.020 1.010 - 1.025   Blood, UA Negative    pH, UA 8.5 (A) 5.0 - 8.0   Protein, UA Negative Negative   Urobilinogen, UA 0.2 0.2 or 1.0 E.U./dL   Nitrite, UA Negative    Leukocytes, UA Negative Negative   Appearance     Odor      ASSESSMENT: 59 y.o. y.o. s/p transurethral Bulkamid injection for stress incontinence.   PLAN: Patient will follow up in 4 weeks to reassess. Voiding and post-procedure precautions were given. She will return  for heavy bleeding, fevers, dysuria lasting beyond today and incomplete emptying. - continue vaginal estrogen 2x/week - consider additional OAB treatments if needed.   All questions were answered.  Lianne ONEIDA Gillis, MD

## 2023-01-26 NOTE — Therapy (Deleted)
 OUTPATIENT PHYSICAL THERAPY FEMALE PELVIC EVALUATION   Patient Name: Rebecca Gentry MRN: [id number] DOB:15-Jun-1965, 58 y.o., female Today's Date: 01/26/2023  END OF SESSION:   Past Medical History:  Diagnosis Date   Allergy    Anal fissure    Anemia    Anxiety    Arthritis    Asthma    Depression    Diverticulosis    Fibromyalgia    GERD (gastroesophageal reflux disease)    HTN (hypertension)    Hyperlipidemia 2023   Irritable bowel syndrome    Sinus arrhythmia    Sleep apnea    Past Surgical History:  Procedure Laterality Date   ABDOMINAL HYSTERECTOMY     partial   COLONOSCOPY     LEEP     UPPER GASTROINTESTINAL ENDOSCOPY  2015   There are no active problems to display for this patient.   PCP: Allene Ivan, MD  REFERRING PROVIDER: Zuleta, Kaitlin G, NP   REFERRING DIAG:  R35.0 (ICD-10-CM) - Urinary frequency  N89.8 (ICD-10-CM) - Vaginal dryness  M62.838 (ICD-10-CM) - Levator spasm    THERAPY DIAG:  No diagnosis found.  Rationale for Evaluation and Treatment: Rehabilitation  ONSET DATE: ***  SUBJECTIVE:                                                                                                                                                                                           SUBJECTIVE STATEMENT: *** Fluid intake: Drinks: 8-12oz Coffee AM, 12oz water, Cranberry juice/Orange juice or gingerale at dinner per day    PAIN:  Are you having pain? {yes/no:20286} NPRS scale: ***/10 Pain location: {pelvic pain location:27098}  Pain type: {type:313116} Pain description: {PAIN DESCRIPTION:21022940}   Aggravating factors: *** Relieving factors: ***  PRECAUTIONS: {Therapy precautions:24002}  RED FLAGS: {PT Red Flags:29287}   WEIGHT BEARING RESTRICTIONS: {Yes ***/No:24003}  FALLS:  Has patient fallen in last 6 months? {fallsyesno:27318}  LIVING ENVIRONMENT: Lives with: {[facility] lives with:25569::"lives with their family"} Lives in:  {Lives in:25570} Stairs: {opstairs:27293} Has following equipment at home: {Assistive devices:23999}  OCCUPATION: ***  PLOF: {PLOF:24004}  PATIENT GOALS: ***  PERTINENT HISTORY:  Hypertension; IBS; Anal fissure; divertiulosis; fibromyalgia; Abdominal hysterectomy Sexual abuse: {Yes/No:304960894}  BOWEL MOVEMENT: Pain with bowel movement: {yes/no:20286} Type of bowel movement:{PT BM type:27100} Fully empty rectum: {Yes/No:304960894} Leakage: {Yes/No:304960894} Pads: {Yes/No:304960894} Fiber supplement: {Yes/No:304960894}  URINATION: Pain with urination: {yes/no:20286} Fully empty bladder: NoWhen urinating, patient feels a weak stream and dribbling after finishing  Stream: {PT urination:27102} Urgency: {Yes/No:304960894} Frequency: Day time voids 5.  Nocturia: 1 times per night to void  Leakage: Coughing, Sneezing, Laughing, Lifting, and full bladder ; Leaks  1 time(s) per days.  Pads:  1 liners/ mini-pads per day.   INTERCOURSE: Pain with intercourse: {pain with intercourse PA:27099} Ability to have vaginal penetration:  {Yes/No:304960894} Climax: *** Marinoff Scale: ***/3  PREGNANCY: Vaginal deliveries *** Tearing {Yes***/No:304960894} C-section deliveries *** Currently pregnant {Yes***/No:304960894}  PROLAPSE: {PT prolapse:27101}   OBJECTIVE:  Note: Objective measures were completed at Evaluation unless otherwise noted.  DIAGNOSTIC FINDINGS:  CST: positive  Pelvic floor strength I/V   Pelvic floor musculature: Right levator tender, Right obturator tender, Left levator tender, Left obturator tender Post Void Residual 13 mL    PATIENT SURVEYS:  {rehab surveys:24030}  PFIQ-7 ***  COGNITION: Overall cognitive status: {cognition:24006}     SENSATION: Light touch: {intact/deficits:24005} Proprioception: {intact/deficits:24005}  MUSCLE LENGTH: Hamstrings: Right *** deg; Left *** deg Thomas test: Right *** deg; Left *** deg  LUMBAR SPECIAL TESTS:   {lumbar special test:25242}  FUNCTIONAL TESTS:  {Functional tests:24029}  GAIT: Distance walked: *** Assistive device utilized: {Assistive devices:23999} Level of assistance: {Levels of assistance:24026} Comments: ***  POSTURE: {posture:25561}  PELVIC ALIGNMENT:  LUMBARAROM/PROM:  A/PROM A/PROM  eval  Flexion   Extension   Right lateral flexion   Left lateral flexion   Right rotation   Left rotation    (Blank rows = not tested)  LOWER EXTREMITY ROM:  {AROM/PROM:27142} ROM Right eval Left eval  Hip flexion    Hip extension    Hip abduction    Hip adduction    Hip internal rotation    Hip external rotation    Knee flexion    Knee extension    Ankle dorsiflexion    Ankle plantarflexion    Ankle inversion    Ankle eversion     (Blank rows = not tested)  LOWER EXTREMITY MMT:  MMT Right eval Left eval  Hip flexion    Hip extension    Hip abduction    Hip adduction    Hip internal rotation    Hip external rotation    Knee flexion    Knee extension    Ankle dorsiflexion    Ankle plantarflexion    Ankle inversion    Ankle eversion     PALPATION:   General  ***                External Perineal Exam ***                             Internal Pelvic Floor ***  Patient confirms identification and approves PT to assess internal pelvic floor and treatment {yes/no:20286}  PELVIC MMT:   MMT eval  Vaginal   Internal Anal Sphincter   External Anal Sphincter   Puborectalis   Diastasis Recti   (Blank rows = not tested)        TONE: ***  PROLAPSE: ***  TODAY'S TREATMENT:  DATE: ***  EVAL ***   PATIENT EDUCATION:  Education details: *** Person educated: {Person educated:25204} Education method: {Education Method:25205} Education comprehension: {Education Comprehension:25206}  HOME EXERCISE  PROGRAM: ***  ASSESSMENT:  CLINICAL IMPRESSION: Patient is a *** y.o. *** who was seen today for physical therapy evaluation and treatment for ***.   OBJECTIVE IMPAIRMENTS: {opptimpairments:25111}.   ACTIVITY LIMITATIONS: {activitylimitations:27494}  PARTICIPATION LIMITATIONS: {participationrestrictions:25113}  PERSONAL FACTORS: {Personal factors:25162} are also affecting patient's functional outcome.   REHAB POTENTIAL: {rehabpotential:25112}  CLINICAL DECISION MAKING: {clinical decision making:25114}  EVALUATION COMPLEXITY: {Evaluation complexity:25115}   GOALS: Goals reviewed with patient? {yes/no:20286}  SHORT TERM GOALS: Target date: ***  *** Baseline: Goal status: INITIAL  2.  *** Baseline:  Goal status: INITIAL  3.  *** Baseline:  Goal status: INITIAL  4.  *** Baseline:  Goal status: INITIAL  5.  *** Baseline:  Goal status: INITIAL  6.  *** Baseline:  Goal status: INITIAL  LONG TERM GOALS: Target date: ***  *** Baseline:  Goal status: INITIAL  2.  *** Baseline:  Goal status: INITIAL  3.  *** Baseline:  Goal status: INITIAL  4.  *** Baseline:  Goal status: INITIAL  5.  *** Baseline:  Goal status: INITIAL  6.  *** Baseline:  Goal status: INITIAL  PLAN:  PT FREQUENCY: {rehab frequency:25116}  PT DURATION: {rehab duration:25117}  PLANNED INTERVENTIONS: {rehab planned interventions:25118::"97110-Therapeutic exercises","97530- Therapeutic [phone]- Neuromuscular re-education","97535- Self HYQM","57846- Manual therapy"}  PLAN FOR NEXT SESSION: ***   Ronnette Rump, PT 01/26/2023, 3:46 PM

## 2023-01-27 ENCOUNTER — Telehealth: Payer: Self-pay | Admitting: Physical Therapy

## 2023-01-27 ENCOUNTER — Ambulatory Visit: Payer: BC Managed Care – PPO | Admitting: Physical Therapy

## 2023-01-27 NOTE — Telephone Encounter (Signed)
 Spoke to patient on the phone. She was not able to come to her appointment today due to being sick. Her next appointment was changed to evaluation.  Marsha Skeen, PT @1 /15/25@ 9:20 AM

## 2023-02-13 ENCOUNTER — Emergency Department (HOSPITAL_COMMUNITY)
Admission: EM | Admit: 2023-02-13 | Discharge: 2023-02-13 | Disposition: A | Discharge status: Home / Self Care | Payer: BC Managed Care – PPO | Attending: Emergency Medicine | Admitting: Emergency Medicine

## 2023-02-13 ENCOUNTER — Other Ambulatory Visit: Payer: Self-pay

## 2023-02-13 ENCOUNTER — Encounter (HOSPITAL_COMMUNITY): Payer: Self-pay | Admitting: *Deleted

## 2023-02-13 DIAGNOSIS — Z79899 Other long term (current) drug therapy: Secondary | ICD-10-CM | POA: Insufficient documentation

## 2023-02-13 DIAGNOSIS — I10 Essential (primary) hypertension: Secondary | ICD-10-CM | POA: Insufficient documentation

## 2023-02-13 DIAGNOSIS — J45909 Unspecified asthma, uncomplicated: Secondary | ICD-10-CM | POA: Diagnosis not present

## 2023-02-13 DIAGNOSIS — Z7951 Long term (current) use of inhaled steroids: Secondary | ICD-10-CM | POA: Diagnosis not present

## 2023-02-13 DIAGNOSIS — Z7982 Long term (current) use of aspirin: Secondary | ICD-10-CM | POA: Diagnosis not present

## 2023-02-13 DIAGNOSIS — R197 Diarrhea, unspecified: Secondary | ICD-10-CM | POA: Diagnosis present

## 2023-02-13 DIAGNOSIS — Z87891 Personal history of nicotine dependence: Secondary | ICD-10-CM | POA: Insufficient documentation

## 2023-02-13 DIAGNOSIS — R110 Nausea: Secondary | ICD-10-CM | POA: Insufficient documentation

## 2023-02-13 LAB — COMPREHENSIVE METABOLIC PANEL
ALT: 20 U/L (ref 0–44)
AST: 27 U/L (ref 15–41)
Albumin: 4 g/dL (ref 3.5–5.0)
Alkaline Phosphatase: 89 U/L (ref 38–126)
Anion gap: 10 (ref 5–15)
BUN: 17 mg/dL (ref 6–20)
CO2: 24 mmol/L (ref 22–32)
Calcium: 9.7 mg/dL (ref 8.9–10.3)
Chloride: 107 mmol/L (ref 98–111)
Creatinine, Ser: 0.9 mg/dL (ref 0.44–1.00)
GFR, Estimated: >60 mL/min (ref >60)
Glucose, Bld: 92 mg/dL (ref 70–99)
Potassium: 3.9 mmol/L (ref 3.5–5.1)
Sodium: 141 mmol/L (ref 135–145)
Total Bilirubin: 0.3 mg/dL (ref 0.0–1.2)
Total Protein: 7.3 g/dL (ref 6.5–8.1)

## 2023-02-13 LAB — CBC WITH DIFFERENTIAL/PLATELET
Abs Immature Granulocytes: 0.03 10*3/uL (ref 0.00–0.07)
Basophils Absolute: 0.1 10*3/uL (ref 0.0–0.1)
Basophils Relative: 1 %
Eosinophils Absolute: 0.2 10*3/uL (ref 0.0–0.5)
Eosinophils Relative: 2 %
HCT: 42.4 % (ref 36.0–46.0)
Hemoglobin: 13.6 g/dL (ref 12.0–15.0)
Immature Granulocytes: 0 %
Lymphocytes Relative: 21 %
Lymphs Abs: 1.7 10*3/uL (ref 0.7–4.0)
MCH: 27.4 pg (ref 26.0–34.0)
MCHC: 32.1 g/dL (ref 30.0–36.0)
MCV: 85.3 fL (ref 80.0–100.0)
Monocytes Absolute: 0.5 10*3/uL (ref 0.1–1.0)
Monocytes Relative: 6 %
Neutro Abs: 5.5 10*3/uL (ref 1.7–7.7)
Neutrophils Relative %: 70 %
Platelets: 387 10*3/uL (ref 150–400)
RBC: 4.97 MIL/uL (ref 3.87–5.11)
RDW: 13.4 % (ref 11.5–15.5)
WBC: 7.9 10*3/uL (ref 4.0–10.5)
nRBC: 0 % (ref 0.0–0.2)

## 2023-02-13 LAB — URINALYSIS, ROUTINE W REFLEX MICROSCOPIC
Bilirubin Urine: NEGATIVE
Glucose, UA: NEGATIVE mg/dL
Hgb urine dipstick: NEGATIVE
Ketones, ur: NEGATIVE mg/dL
Nitrite: NEGATIVE
Protein, ur: NEGATIVE mg/dL
Specific Gravity, Urine: 1.03 (ref 1.005–1.030)
pH: 5 (ref 5.0–8.0)

## 2023-02-13 LAB — LIPASE, BLOOD: Lipase: 41 U/L (ref 11–51)

## 2023-02-13 MED ORDER — DICYCLOMINE HCL 20 MG PO TABS: 20.0000 mg | ORAL_TABLET | Freq: Two times a day (BID) | ORAL | 0 refills | Status: AC

## 2023-02-13 NOTE — ED Triage Notes (Addendum)
Here by POV from home. Works at a daycare. Here for diarrhea with associated nausea and abd cramping. Sx ongoing for ~ 2 weeks. Describes stool as water, then loose with mucus and today possibly worms. Denies fever or vomiting, blood or travel. Denies recent antibiotics. Unsure of cause. No known sick contacts or events. Alert, NAD, calm, interactive, steady gait.

## 2023-02-13 NOTE — ED Provider Notes (Addendum)
[place] EMERGENCY DEPARTMENT AT [facility] Provider Note  CSN: [id number] Arrival date & time: 02/13/23 4098  Chief Complaint(s) Diarrhea  HPI Rebecca Gentry is a 58 y.o. female history of IBS, GERD, hypertension, fibromyalgia presenting to the emergency department diarrhea.  Patient reports 2 weeks of diarrhea.  Denies any melena, blood in her stool.  No recent travel or antibiotic use.  She reports a couple episodes of nausea, no vomiting.  She reports some abdominal cramping which comes and goes.  Does work at a daycare.  Has been taking Imodium and dicyclomine which helped some.  Today she thought she saw some worms in her stool so she came to the emergency department.  Did not bring stool sample.   Past Medical History Past Medical History:  Diagnosis Date   Allergy    Anal fissure    Anemia    Anxiety    Arthritis    Asthma    Depression    Diverticulosis    Fibromyalgia    GERD (gastroesophageal reflux disease)    HTN (hypertension)    Hyperlipidemia 2023   Irritable bowel syndrome    Sinus arrhythmia    Sleep apnea    There are no active problems to display for this patient.  Home Medication(s) Prior to Admission medications   Medication Sig Start Date End Date Taking? Authorizing Provider  dicyclomine (BENTYL) 20 MG tablet Take 1 tablet (20 mg total) by mouth 2 (two) times daily. 02/13/23  Yes Lonell Grandchild, MD  albuterol (VENTOLIN HFA) 108 (90 Base) MCG/ACT inhaler Inhale 1 puff into the lungs daily as needed. 01/28/18   [provider]  Ascorbic Acid (VITAMIN C) 1000 MG tablet Take 1,000 mg by mouth daily.    [provider]  aspirin 325 MG tablet Take 650 mg by mouth every 4 (four) hours as needed for mild pain or headache.    [provider]  azelastine (ASTELIN) 0.1 % nasal spray 1-2 puff in each nostril Nasally Twice a day PRN for 30 days    [provider]  BELSOMRA 10 MG TABS Take 1 tablet by mouth at  bedtime as needed. 10/29/20   [provider]  calcium carbonate (OS-CAL) 1250 (500 Ca) MG chewable tablet Chew 1 tablet by mouth 2 (two) times daily.    [provider]  desipramine (NORPRAMIN) 25 MG tablet Take 25 mg by mouth daily.    [provider]  diazepam (VALIUM) 5 MG tablet Take one tablet by mouth 30 minutes before Urethral bulking and you will need a driver following procedure. 11/12/22   Selmer Dominion, NP  diclofenac Sodium (VOLTAREN) 1 % GEL Apply topically. 08/28/20   [provider]  Estradiol 10 MCG TABS vaginal tablet Place 1 tablet (10 mcg total) vaginally at bedtime. Place 1 tab nightly for two weeks then twice a week after 01/18/23   Selmer Dominion, NP  fexofenadine [facility] ALLERGY) 180 MG tablet  06/12/20   [provider]  fluticasone (FLONASE) 50 MCG/ACT nasal spray Place 1 spray into both nostrils 2 (two) times daily.    [provider]  ibuprofen (ADVIL) 800 MG tablet Take 800 mg by mouth 3 (three) times daily. 09/17/20   [provider]  loperamide (IMODIUM) 2 MG capsule Take 1 capsule (2 mg total) by mouth as needed for diarrhea or loose stools. 10/19/21   Redwine, Madison A, PA-C  Magnesium 200 MG TABS Take 200 mg by mouth daily.  [provider]  Meclizine HCl 25 MG CHEW Chew by mouth. 01/28/18   [provider]  methocarbamol (ROBAXIN) 500 MG tablet Take 1 tablet (500 mg total) by mouth every 8 (eight) hours as needed for muscle spasms. 10/11/20   Benjiman Core, MD  methylcellulose (CITRUCEL) oral powder Take 1 packet by mouth daily. 08/13/22   Armbruster, Willaim Rayas, MD  mometasone-formoterol (DULERA) 100-5 MCG/ACT AERO Inhale 2 puffs into the lungs 2 (two) times daily.    [provider]  Multiple Vitamin (MULTIVITAMIN WITH MINERALS) TABS tablet Take 1 tablet by mouth daily.    [provider]  nebivolol (BYSTOLIC) 5 MG tablet Take 5 mg by mouth daily.    [provider]  omeprazole (PRILOSEC) 20 MG capsule Take 1 capsule (20 mg total) by mouth 2 (two) times daily before a meal. Patient taking differently: Take 40 mg by mouth 2 (two) times daily before a meal. 08/13/22   Armbruster, Willaim Rayas, MD  OVER THE COUNTER MEDICATION NutriDyn 80mg  : Take one capsule twice a day    [provider]  polyvinyl alcohol (LIQUIFILM TEARS) 1.4 % ophthalmic solution Place 1 drop into both eyes as needed for dry eyes.    [provider]  Probiotic Product (ALIGN) 4 MG CAPS Take 1 capsule by mouth daily.    [provider]  rosuvastatin (CRESTOR) 10 MG tablet Take 10 mg by mouth at bedtime. 04/07/22   [provider]  tiZANidine (ZANAFLEX) 2 MG tablet Take by mouth. 01/28/18   [provider]  VITAMIN D PO Take by mouth.    [provider]                                                                                                                                    Past Surgical History Past Surgical History:  Procedure Laterality Date   ABDOMINAL HYSTERECTOMY     partial   COLONOSCOPY     LEEP     UPPER GASTROINTESTINAL ENDOSCOPY  2015   Family History Family History  Problem Relation Age of Onset   Colon polyps Mother    Breast cancer Mother    Heart disease Mother    Irritable bowel syndrome Mother    Kidney disease Mother    Other Father        bowel obstruction/ sepis   Lung cancer Brother        smoker   Breast cancer Paternal Aunt    Esophageal cancer Neg Hx    Colon cancer Neg Hx    Rectal cancer Neg Hx    Stomach cancer Neg Hx     Social History Social History   Tobacco Use   Smoking status: Former    Current packs/day: 0.00    Types: Cigarettes    Quit date: 1995    Years since quitting: 30.1   Smokeless tobacco: Never  Vaping  Use   Vaping status: Never Used  Substance Use Topics   Alcohol use: Never   Drug use: Never   Allergies Penicillin g, Apple juice, and  Other  Review of Systems Review of Systems  All other systems reviewed and are negative.   Physical Exam Vital Signs  I have reviewed the triage vital signs BP 134/70 (BP Location: Right Arm)   Pulse 65   Temp 98 F (36.7 C) (Oral)   Resp 18   Wt 73 kg   SpO2 100%   BMI 26.79 kg/m  Physical Exam Vitals and nursing note reviewed.  Constitutional:      General: She is not in acute distress.    Appearance: She is well-developed.  HENT:     Head: Normocephalic and atraumatic.     Mouth/Throat:     Mouth: Mucous membranes are moist.  Eyes:     Pupils: Pupils are equal, round, and reactive to light.  Cardiovascular:     Rate and Rhythm: Normal rate and regular rhythm.     Heart sounds: No murmur heard. Pulmonary:     Effort: Pulmonary effort is normal. No respiratory distress.     Breath sounds: Normal breath sounds.  Abdominal:     General: Abdomen is flat.     Palpations: Abdomen is soft.     Tenderness: There is no abdominal tenderness.  Musculoskeletal:        General: No tenderness.     Right lower leg: No edema.     Left lower leg: No edema.  Skin:    General: Skin is warm and dry.  Neurological:     General: No focal deficit present.     Mental Status: She is alert. Mental status is at baseline.  Psychiatric:        Mood and Affect: Mood normal.        Behavior: Behavior normal.     ED Results and Treatments Labs (all labs ordered are listed, but only abnormal results are displayed) Labs Reviewed  URINALYSIS, ROUTINE W REFLEX MICROSCOPIC - Abnormal; Notable for the following components:      Result Value   APPearance HAZY (*)    Leukocytes,Ua TRACE (*)    Bacteria, UA RARE (*)    All other components within normal limits  OVA + PARASITE EXAM  GASTROINTESTINAL PANEL BY PCR, STOOL (REPLACES STOOL CULTURE)  LIPASE, BLOOD  COMPREHENSIVE METABOLIC PANEL  CBC WITH DIFFERENTIAL/PLATELET                                                                                                                           Radiology No results found.  Pertinent labs & imaging results that were available during my care of the patient were reviewed by me and considered in my medical decision making (see MDM for details).  Medications Ordered in ED Medications - No data to display  Procedures Procedures  (including critical care time)  Medical Decision Making / ED Course   MDM:  58 year old presenting to the emergency department with diarrhea.  Patient reports 2 weeks of diarrhea.  She is overall well-appearing, physical examination with no abdominal tenderness.  Patient does not appear dehydrated.  Vital signs are reassuring.  Unclear cause of her diarrhea, could be related to underlying IBS, could be due to underlying viral infection or mild bacterial illness, laboratory testing is overall reassuring, no leukocytosis or anemia, she denies any blood in her stool or melena.  She does report seeing some "worms" in her stool, but did not bring stool sample.  Will attempt to obtain stool PCR and ova and parasite sample if patient is able to provide stool sample.  She is not sure if she will be able to.  She is not able to provide a stool sample, anticipate discharge.  Low concern for any underlying dangerous cause of her symptoms such as abscess, diverticulitis, perforation, obstruction, invasive diarrhea, C. Difficile.  She has a follow-up appointment with her primary doctor on Monday and she also has a GI doctor I would recommend that she also follows up with.  Clinical Course as of 02/13/23 1656  Sat Feb 13, 2023  1640 Patient still has not been able to provide a stool sample in the emergency department.  Low concern for any dangerous cause of diarrhea given reassuring workup, but recommended follow-up with her GI physician and  primary doctor.  She has an appointment with them in 2 days. Will discharge patient to home. All questions answered. Patient comfortable with plan of discharge. Return precautions discussed with patient and specified on the after visit summary.  [WS]  1655 Patient did give a stool sample. Will send to lab. Pt aware to review results online and with her PMD Monday. [WS]    Clinical Course User Index [WS] Lonell Grandchild, MD      Lab Tests: -I ordered, reviewed, and interpreted labs.   The pertinent results include:   Labs Reviewed  URINALYSIS, ROUTINE W REFLEX MICROSCOPIC - Abnormal; Notable for the following components:      Result Value   APPearance HAZY (*)    Leukocytes,Ua TRACE (*)    Bacteria, UA RARE (*)    All other components within normal limits  OVA + PARASITE EXAM  GASTROINTESTINAL PANEL BY PCR, STOOL (REPLACES STOOL CULTURE)  LIPASE, BLOOD  COMPREHENSIVE METABOLIC PANEL  CBC WITH DIFFERENTIAL/PLATELET    Notable for normal cbc, cmp, and lipase  Medicines ordered and prescription drug management: Meds ordered this encounter  Medications   dicyclomine (BENTYL) 20 MG tablet    Sig: Take 1 tablet (20 mg total) by mouth 2 (two) times daily.    Dispense:  20 tablet    Refill:  0    -I have reviewed the patients home medicines and have made adjustments as needed    Reevaluation: After the interventions noted above, I reevaluated the patient and found that their symptoms have improved  Co morbidities that complicate the patient evaluation  Past Medical History:  Diagnosis Date   Allergy    Anal fissure    Anemia    Anxiety    Arthritis    Asthma    Depression    Diverticulosis    Fibromyalgia    GERD (gastroesophageal reflux disease)    HTN (hypertension)    Hyperlipidemia 2023   Irritable bowel syndrome    Sinus arrhythmia  Sleep apnea       Dispostion: Disposition decision including need for hospitalization was considered, and patient  discharged from emergency department.    Final Clinical Impression(s) / ED Diagnoses Final diagnoses:  Diarrhea, unspecified type     This chart was dictated using voice recognition software.  Despite best efforts to proofread,  errors can occur which can change the documentation meaning.    Lonell Grandchild, MD 02/13/23 1641    Lonell Grandchild, MD 02/13/23 [phone]

## 2023-02-13 NOTE — Discharge Instructions (Signed)
We evaluated you in the emergency department for your diarrhea.  Your testing in the emergency department is reassuring.  Please follow-up with your primary care doctor.  You may need further testing such as stool studies.  Please also call your gastroenterology doctor for follow-up.  Please return if you develop any new symptoms such as high fevers, severe pain, bloody stools, lightheadedness or dizziness, fainting, or any other concerning symptoms.

## 2023-02-14 LAB — GASTROINTESTINAL PANEL BY PCR, STOOL (REPLACES STOOL CULTURE)

## 2023-02-17 ENCOUNTER — Ambulatory Visit: Payer: BC Managed Care – PPO | Admitting: Physical Therapy

## 2023-02-18 ENCOUNTER — Ambulatory Visit: Payer: BC Managed Care – PPO | Admitting: Obstetrics and Gynecology

## 2023-02-18 ENCOUNTER — Encounter: Payer: Self-pay | Admitting: Obstetrics and Gynecology

## 2023-02-18 VITALS — BP 130/67 | HR 82

## 2023-02-18 DIAGNOSIS — R152 Fecal urgency: Secondary | ICD-10-CM

## 2023-02-18 DIAGNOSIS — M6289 Other specified disorders of muscle: Secondary | ICD-10-CM | POA: Diagnosis not present

## 2023-02-18 DIAGNOSIS — N393 Stress incontinence (female) (male): Secondary | ICD-10-CM | POA: Diagnosis not present

## 2023-02-18 DIAGNOSIS — N898 Other specified noninflammatory disorders of vagina: Secondary | ICD-10-CM | POA: Diagnosis not present

## 2023-02-18 NOTE — Patient Instructions (Signed)
 Consider adding in Fiber supplementation such as Metamucil to assist your bowels or doing a low reside diet.

## 2023-02-18 NOTE — Progress Notes (Signed)
 [facility] Return Visit  SUBJECTIVE  History of Present Illness: Rebecca Gentry is a 58 y.o. female seen in follow-up for SUI. Plan at last visit was undergo Urethral bulking.   Patient reports she has had recent GI disturbances that are her main concern. She reports she believes she saw a parasite in her stool and went to the ER for evaluation and was told she her stool sample was clear. She reports she is having fecal urgency and diarrhea.   As far as her urinary urgency and SUI go, she reports this is 80% better. She states her leakage with cough/sneeze has drastically reduced and she feels her urgency is also a little better.   Patient has not yet done pelvic floor PT for her pelvic floor dysfunction and reports she is planning to wait until some of her GI issues have cleared.    Past Medical History: Patient  has a past medical history of Allergy, Anal fissure, Anemia, Anxiety, Arthritis, Asthma, Depression, Diverticulosis, Fibromyalgia, GERD (gastroesophageal reflux disease), HTN (hypertension), Hyperlipidemia (2023), Irritable bowel syndrome, Sinus arrhythmia, and Sleep apnea.   Past Surgical History: She  has a past surgical history that includes Abdominal hysterectomy; LEEP; Colonoscopy; and Upper gastrointestinal endoscopy (2015).   Medications: She has a current medication list which includes the following prescription(s): albuterol, vitamin c, aspirin , azelastine, belsomra, calcium carbonate, desipramine, diazepam , diclofenac sodium, dicyclomine , estradiol , fexofenadine, fluticasone, ibuprofen, loperamide , magnesium, meclizine hcl, methocarbamol , citrucel, mometasone-formoterol, multivitamin with minerals, nebivolol, omeprazole , OVER THE COUNTER MEDICATION, polyvinyl alcohol, align, rosuvastatin, tizanidine, and vitamin d.   Allergies: Patient is allergic to penicillin g, apple juice, and other.   Social History: Patient  reports that she quit smoking about 30  years ago. Her smoking use included cigarettes. She has never used smokeless tobacco. She reports that she does not drink alcohol and does not use drugs.     OBJECTIVE     Physical Exam: Vitals:   02/18/23 0819  BP: 130/67  Pulse: 82   Gen: No apparent distress, A&O x 3.  Detailed Urogynecologic Evaluation:  Deferred.    ASSESSMENT AND PLAN    Rebecca Gentry is a 58 y.o. with:  1. SUI (stress urinary incontinence, female)   2. Fecal urgency   3. Vaginal dryness   4. Pelvic floor dysfunction in female     Patient reports her SUI is greatly improved.  Encouraged patient to add in Metamucil for her stool for bulking to reduce fecal urgency. Patient is already on Bentyl .  Patient reports vaginal dryness is controlled decently with estrogen cream x2 weekly. Patient has not begun to address her pelvic floor dysfunction as she has not yet started PT. Will re-check pelvic floor muscles once patient completes PT.   Patient to return in 6 months or sooner if  Joelynn Dust G Osmani Kersten, NP

## 2023-02-24 ENCOUNTER — Encounter: Payer: BC Managed Care – PPO | Admitting: Physical Therapy

## 2023-03-03 ENCOUNTER — Encounter: Payer: BC Managed Care – PPO | Admitting: Physical Therapy

## 2023-03-12 ENCOUNTER — Other Ambulatory Visit: Payer: Self-pay

## 2023-03-12 ENCOUNTER — Telehealth: Payer: Self-pay | Admitting: Gastroenterology

## 2023-03-12 DIAGNOSIS — R1032 Left lower quadrant pain: Secondary | ICD-10-CM

## 2023-03-12 NOTE — Telephone Encounter (Signed)
 Scheduled CT with pt for 03/23/23 at 8:00 am, arrive by 7:45 am at [facility]. She would prefer to hold off on stool sample, until we get results from PCP office. Left detailed voicemail for the CMA at Dr. Wendee Copp office requesting they fax most recent stool results to [phone]. Pt is aware.

## 2023-03-12 NOTE — Telephone Encounter (Signed)
 Left message for patient to call back

## 2023-03-12 NOTE — Telephone Encounter (Signed)
 Patient called stated she is experiencing diarrhea and stomach pain for a few weeks now seeking advise.

## 2023-03-12 NOTE — Telephone Encounter (Signed)
 Patient called in with complaints of intermittent diarrhea & LLQ pain (4/10). States she was recently treated for worms at [facility] ED on 02/13/23, and was prescribed a medication for 3 days however this is not mentioned in visit note. She says PCP repeated stool test a little over 2 weeks ago which were negative. She is concerned that she needs to be doing something in the meantime while she waits for 3/12 appointment with Marchelle Folks, [place] & would like further recommendations.

## 2023-03-16 ENCOUNTER — Telehealth: Payer: Self-pay | Admitting: Gastroenterology

## 2023-03-16 NOTE — Telephone Encounter (Signed)
 We received the stool specimen lab results from Dr Richter's office. I have placed them in Dr Lanetta Inch office for review.

## 2023-03-16 NOTE — Telephone Encounter (Signed)
 Stool tests arrived  Done 02/20/23:  NEGATIVE for cryptosporidia, giardia, and WBCs and bacteria (salmonella, shigella, campylobacter, yersinia)  Will await pending CT scan and clinic visit that is scheduled

## 2023-03-23 ENCOUNTER — Encounter (HOSPITAL_COMMUNITY): Payer: Self-pay

## 2023-03-23 ENCOUNTER — Ambulatory Visit (HOSPITAL_COMMUNITY)
Admission: RE | Admit: 2023-03-23 | Discharge: 2023-03-23 | Disposition: A | Discharge status: Home / Self Care | Payer: BC Managed Care – PPO | Source: Ambulatory Visit | Attending: Internal Medicine | Admitting: Internal Medicine

## 2023-03-23 DIAGNOSIS — R1032 Left lower quadrant pain: Secondary | ICD-10-CM | POA: Insufficient documentation

## 2023-03-23 MED ORDER — IOHEXOL 300 MG/ML  SOLN
100.0000 mL | Freq: Once | INTRAMUSCULAR | Status: AC | PRN
  Administered 2023-03-23: 100 mL via INTRAVENOUS

## 2023-03-23 NOTE — Progress Notes (Unsigned)
 03/24/2023 Rebecca Gentry [id number] [birth year]  Referring provider: Dois Davenport, MD Primary GI doctor: Dr. 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  Will give Levsin as needed in the mean time, bentyl not helpful Take imodium before eating, max 16 mg /day Add on citracel/benefiber FODMAP, trial off lactulose and lifestyle changes discussed If labs, stool and CT negative, consider repeat colon for microscopic colitis but this appears more inflammatory/infections with symptoms, possible post infectious IBS Consider SIBO testing or xifaxin trial pending results  GERD Lifestyle changes discussed, avoid NSAIDS, ETOH, hand out given to the patient Continue medications  Diverticulosis Add on fiber supplement, avoid NSAIDS, information given  Epigastric AB pain + Carnett's sign Salon pas patches, heating pad, consider injection  Patient Care Team: Dois Davenport, MD as PCP - General (Family Medicine)  HISTORY OF PRESENT ILLNESS: Discussed the use of AI scribe software for clinical note transcription with the patient, who gave verbal consent to proceed.  History of Present Illness   The patient is a 58 year old with IBS who presents with diarrhea.  She has  been experiencing diarrhea, pain, and bloating since January 29, 2023. The diarrhea occurs variably, with up to seven episodes in a day, but has had 2-3 days during the 2 months without a BM. The stools are loose and watery, with occasional mucus, but no blood. Abdominal pain is primarily on the left side, but also on the right and across the abdomen, sometimes improving after a bowel movement. She has had chills and a fever of 100.60F once during this period.  She visited the ER on February 13, 2023, due to noticing what appeared to be worms in her stool, but the stool sample was negative for pathogens. Despite this, she continues to experience diarrhea and bloating. She has lost about three pounds and has had to miss work due to her symptoms.  Her past medical history includes IBS, and prior to this episode, she would have diarrhea once every seven to ten days, with occasional constipation. She has been taking hydroxyzine since March 2025 for IBS and anxiety, which has not significantly changed her symptoms. She also takes dicyclomine once or twice a day, which initially helped but seems less effective now. Imodium is used once or twice a day, providing some relief.  She stopped taking Citrucel and vitamin D for a while but has recently restarted vitamin D due to low levels in her blood work. She is concerned about the impact of her symptoms on her daily life and work.  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 No rectal bleeding. No significant weight loss, having lost about three pounds. No recent antibiotic use.  She  reports that she quit smoking about 30 years ago. Her smoking use included cigarettes. She has never used smokeless tobacco. She reports that she does not drink alcohol and does not use drugs.  RELEVANT GI HISTORY, IMAGING AND LABS: Results   LABS ER visit labs: No leukocytosis, normal renal function, normal  hepatic function, no anemia, normal lipase, GI pathogen negative (02/13/2023)  DIAGNOSTIC Colonoscopy: Diverticulosis in the sigmoid and transverse colon, hypertrophied anal papilla, otherwise unremarkable (11/07/2020) Colonoscopy: Poor preparation (01/13/2019) Endoscopy: 1 cm hiatal hernia, [place] classification grade IV esophagitis, multiple gastric polyps, benign fundic glands, normal esophagus, normal stomach, diverticulum (09/11/2022)      CBC    Component Value Date/Time   WBC 7.9 02/13/2023 0756   RBC 4.97 02/13/2023 0756   HGB 13.6 02/13/2023 0756   HCT 42.4 02/13/2023 0756   PLT 387 02/13/2023 0756   MCV 85.3 02/13/2023 0756   MCH 27.4 02/13/2023 0756   MCHC 32.1 02/13/2023 0756   RDW 13.4 02/13/2023 0756   LYMPHSABS 1.7 02/13/2023 0756   MONOABS 0.5 02/13/2023 0756   EOSABS 0.2 02/13/2023 0756   BASOSABS 0.1 02/13/2023 0756   Recent Labs    02/13/23 0756  HGB 13.6    CMP     Component Value Date/Time   NA 141 02/13/2023 0756   K 3.9 02/13/2023 0756   CL 107 02/13/2023 0756   CO2 24 02/13/2023 0756   GLUCOSE 92 02/13/2023 0756   BUN 17 02/13/2023 0756   CREATININE 0.90 02/13/2023 0756   CALCIUM 9.7 02/13/2023 0756   PROT 7.3 02/13/2023 0756   ALBUMIN 4.0 02/13/2023 0756   AST 27 02/13/2023 0756   ALT 20 02/13/2023 0756   ALKPHOS 89 02/13/2023 0756   BILITOT 0.3 02/13/2023 0756   GFRNONAA >60 02/13/2023 0756   GFRAA >60 11/19/2018 0656      Latest Ref Rng & Units 02/13/2023    7:56 AM 10/19/2021    6:34 AM 10/11/2020    4:15 PM  Hepatic Function  Total Protein 6.5 - 8.1 g/dL 7.3  6.9  7.5   Albumin 3.5 - 5.0 g/dL 4.0  3.9  4.5   AST 15 - 41 U/L 27  17  20    ALT 0 - 44 U/L 20  18  18    Alk Phosphatase 38 - 126 U/L 89  63  70   Total Bilirubin 0.0 - 1.2 mg/dL 0.3  0.4  0.5       Current Medications:    Current Outpatient Medications (Cardiovascular):    nebivolol (BYSTOLIC) 5 MG tablet, Take 5 mg by mouth daily.   rosuvastatin (CRESTOR) 10  MG tablet, Take 10 mg by mouth at bedtime.  Current Outpatient Medications (Respiratory):    albuterol (VENTOLIN HFA) 108 (90 Base) MCG/ACT inhaler, Inhale 1 puff into the lungs daily as needed.   azelastine (ASTELIN) 0.1 % nasal spray, 1-2 puff in each nostril Nasally Twice a day PRN for 30 days   fexofenadine (ALLEGRA ALLERGY) 180 MG tablet,    fluticasone (FLONASE) 50 MCG/ACT nasal spray, Place 1 spray into both nostrils 2 (two) times daily.   mometasone-formoterol (DULERA) 100-5 MCG/ACT AERO, Inhale 2 puffs into the lungs 2 (two) times daily.  Current Outpatient Medications (Analgesics):    aspirin 325 MG tablet, Take 650 mg by mouth every 4 (four) hours as needed for mild pain or headache.   ibuprofen (ADVIL) 800 MG tablet, Take 800 mg by mouth 3 (three) times  daily.   Current Outpatient Medications (Other):    Ascorbic Acid (VITAMIN C) 1000 MG tablet, Take 1,000 mg by mouth daily.   BELSOMRA 10 MG TABS, Take 1 tablet by mouth at bedtime as needed.   calcium carbonate (OS-CAL) 1250 (500 Ca) MG chewable tablet, Chew 1 tablet by mouth 2 (two) times daily.   desipramine (NORPRAMIN) 25 MG tablet, Take 25 mg by mouth daily.   diclofenac Sodium (VOLTAREN) 1 % GEL, Apply topically.   dicyclomine (BENTYL) 20 MG tablet, Take 1 tablet (20 mg total) by mouth 2 (two) times daily.   Estradiol 10 MCG TABS vaginal tablet, Place 1 tablet (10 mcg total) vaginally at bedtime. Place 1 tab nightly for two weeks then twice a week after   hydrOXYzine (ATARAX) 10 MG tablet, Take 10 mg by mouth every 4 (four) hours as needed.   hyoscyamine (LEVSIN) 0.125 MG tablet, Take 1 tablet (0.125 mg total) by mouth every 6 (six) hours as needed for cramping.   loperamide (IMODIUM) 2 MG capsule, Take 1 capsule (2 mg total) by mouth as needed for diarrhea or loose stools.   Magnesium 200 MG TABS, Take 200 mg by mouth daily.   Meclizine HCl 25 MG CHEW, Chew by mouth.   methocarbamol (ROBAXIN) 500 MG tablet, Take 1 tablet  (500 mg total) by mouth every 8 (eight) hours as needed for muscle spasms.   methylcellulose (CITRUCEL) oral powder, Take 1 packet by mouth daily.   Multiple Vitamin (MULTIVITAMIN WITH MINERALS) TABS tablet, Take 1 tablet by mouth daily.   omeprazole (PRILOSEC) 20 MG capsule, Take 1 capsule (20 mg total) by mouth 2 (two) times daily before a meal. (Patient taking differently: Take 40 mg by mouth 2 (two) times daily before a meal.)   OVER THE COUNTER MEDICATION, NutriDyn 80mg  : Take one capsule twice a day   polyvinyl alcohol (LIQUIFILM TEARS) 1.4 % ophthalmic solution, Place 1 drop into both eyes as needed for dry eyes.   Probiotic Product (ALIGN) 4 MG CAPS, Take 1 capsule by mouth daily.   VITAMIN D PO, Take by mouth.  Medical History:  Past Medical History:  Diagnosis Date   Allergy    Anal fissure    Anemia    Anxiety    Arthritis    Asthma    Depression    Diverticulosis    Fibromyalgia    GERD (gastroesophageal reflux disease)    HTN (hypertension)    Hyperlipidemia 2023   Irritable bowel syndrome    Sinus arrhythmia    Sleep apnea    Allergies:  Allergies  Allergen Reactions   Penicillin G     Other reaction(s): Other (See Comments) hives   Apple Juice Swelling    It is just APPLES NOT APPLE JUICE THAT CAUSES THIS   Other     Ingredient in Sun screen- She uses Baby Sunscreen with no issues      Surgical History:  She  has a past surgical history that includes Abdominal hysterectomy; LEEP; Colonoscopy; and Upper gastrointestinal endoscopy (2015). Family History:  Her family history includes Breast cancer in her mother and paternal aunt; Colon polyps in her mother; Heart disease in her mother; Irritable bowel syndrome in her mother; Kidney disease in her mother; Lung cancer in her brother; Other in her father.  REVIEW OF SYSTEMS  : All other systems reviewed and negative except where noted in the History of Present Illness.  PHYSICAL EXAM: BP (!) 142/80   Pulse 64  Ht 5\' 6"  (1.676 m)   Wt 160 lb 9.6 oz (72.8 kg)   BMI 25.92 kg/m  Physical Exam   GENERAL APPEARANCE: Well nourished, in no apparent distress. HEENT: No cervical lymphadenopathy, unremarkable thyroid, sclerae anicteric, conjunctiva pink. RESPIRATORY: Respiratory effort normal, BS equal bilateral without rales, rhonchi, wheezing. CARDIO: RRR with no MRGs, peripheral pulses intact. ABDOMEN: Soft, non-distended, active bowel sounds in all 4 quadrants, epigastric region tender, worse on palpation, abdominal muscles tender on leg lift, no pain in left or right lower quadrants. RECTAL: Declines. MUSCULOSKELETAL: Full ROM, normal gait, without edema. SKIN: Dry, intact without rashes or lesions. No jaundice. NEURO: Alert, oriented, no focal deficits. PSYCH: Cooperative, normal mood and affect.      Doree Albee, PA-C 8:54 AM

## 2023-03-24 ENCOUNTER — Other Ambulatory Visit (INDEPENDENT_AMBULATORY_CARE_PROVIDER_SITE_OTHER)

## 2023-03-24 ENCOUNTER — Ambulatory Visit: Payer: BC Managed Care – PPO | Admitting: Physician Assistant

## 2023-03-24 ENCOUNTER — Encounter: Payer: Self-pay | Admitting: Physician Assistant

## 2023-03-24 VITALS — BP 142/70 | HR 64 | Ht 66.0 in | Wt 160.6 lb

## 2023-03-24 DIAGNOSIS — K5730 Diverticulosis of large intestine without perforation or abscess without bleeding: Secondary | ICD-10-CM

## 2023-03-24 DIAGNOSIS — R1013 Epigastric pain: Secondary | ICD-10-CM

## 2023-03-24 DIAGNOSIS — Z8719 Personal history of other diseases of the digestive system: Secondary | ICD-10-CM

## 2023-03-24 DIAGNOSIS — R197 Diarrhea, unspecified: Secondary | ICD-10-CM

## 2023-03-24 DIAGNOSIS — K219 Gastro-esophageal reflux disease without esophagitis: Secondary | ICD-10-CM | POA: Diagnosis not present

## 2023-03-24 DIAGNOSIS — K582 Mixed irritable bowel syndrome: Secondary | ICD-10-CM

## 2023-03-24 DIAGNOSIS — R1012 Left upper quadrant pain: Secondary | ICD-10-CM

## 2023-03-24 LAB — CBC WITH DIFFERENTIAL/PLATELET
Basophils Absolute: 0.1 10*3/uL (ref 0.0–0.1)
Basophils Relative: 0.9 % (ref 0.0–3.0)
Eosinophils Absolute: 0.1 10*3/uL (ref 0.0–0.7)
Eosinophils Relative: 2.1 % (ref 0.0–5.0)
HCT: 42.2 % (ref 36.0–46.0)
Hemoglobin: 14 g/dL (ref 12.0–15.0)
Lymphocytes Relative: 29.5 % (ref 12.0–46.0)
Lymphs Abs: 1.9 10*3/uL (ref 0.7–4.0)
MCHC: 33.3 g/dL (ref 30.0–36.0)
MCV: 82.9 fl (ref 78.0–100.0)
Monocytes Absolute: 0.5 10*3/uL (ref 0.1–1.0)
Monocytes Relative: 7.2 % (ref 3.0–12.0)
Neutro Abs: 4 10*3/uL (ref 1.4–7.7)
Neutrophils Relative %: 60.3 % (ref 43.0–77.0)
Platelets: 320 10*3/uL (ref 150.0–400.0)
RBC: 5.09 Mil/uL (ref 3.87–5.11)
RDW: 14 % (ref 11.5–15.5)
WBC: 6.6 10*3/uL (ref 4.0–10.5)

## 2023-03-24 LAB — COMPREHENSIVE METABOLIC PANEL
ALT: 17 U/L (ref 0–35)
AST: 19 U/L (ref 0–37)
Albumin: 4.6 g/dL (ref 3.5–5.2)
Alkaline Phosphatase: 90 U/L (ref 39–117)
BUN: 13 mg/dL (ref 6–23)
CO2: 27 meq/L (ref 19–32)
Calcium: 9.9 mg/dL (ref 8.4–10.5)
Chloride: 104 meq/L (ref 96–112)
Creatinine, Ser: 0.78 mg/dL (ref 0.40–1.20)
GFR: 84.48 mL/min (ref >60.00)
Glucose, Bld: 101 mg/dL — ABNORMAL HIGH (ref 70–99)
Potassium: 3.8 meq/L (ref 3.5–5.1)
Sodium: 141 meq/L (ref 135–145)
Total Bilirubin: 0.3 mg/dL (ref 0.2–1.2)
Total Protein: 7.3 g/dL (ref 6.0–8.3)

## 2023-03-24 LAB — TSH: TSH: 1.27 u[IU]/mL (ref 0.35–5.50)

## 2023-03-24 LAB — SEDIMENTATION RATE: Sed Rate: 15 mm/h (ref 0–30)

## 2023-03-24 LAB — HIGH SENSITIVITY CRP: CRP, High Sensitivity: 1.42 mg/L (ref 0.000–5.000)

## 2023-03-24 MED ORDER — HYOSCYAMINE SULFATE 0.125 MG PO TABS
0.1250 mg | ORAL_TABLET | Freq: Four times a day (QID) | ORAL | 0 refills | Status: AC | PRN
Start: 2023-03-24 — End: ?

## 2023-03-24 NOTE — Progress Notes (Signed)
 Agree with assessment and plan as outlined.

## 2023-03-24 NOTE — Patient Instructions (Addendum)
 Your provider has requested that you go to the basement level for lab work before leaving today. Press "B" on the elevator. The lab is located at the first door on the left as you exit the elevator.  - Can try loperamide 4 mg initially, then 2 mg after each unformed stool for =2 days, with a maximum of 16 mg/day.  - If loperamide is not working, you could try bismuth salicylate (Pepto-Bismol) 30 mL or two tablets every 30 minutes for eight doses. Pepto-Bismol may make your stools black.   Go to the ER if any severe abdominal pain, fever, or weakness   First do a trial off milk/lactose products if you use them.  Add fiber like benefiber or citracel once a day Can send in an anti spasm medication, Levsin, to take as needed    FODMAP stands for fermentable oligo-, di-, mono-saccharides and polyols (1). These are the scientific terms used to classify groups of carbs that are difficult for our body to digest and that are notorious for triggering digestive symptoms like bloating, gas, loose stools and stomach pain.   You can try low FODMAP diet  - start with eliminating just one column at a time that you feel may be a trigger for you. - the table at the very bottom contains foods that are low in FODMAPs   Sometimes trying to eliminate the FODMAP's from your diet is difficult or tricky, if you are stuggling with trying to do the elimination diet you can try an enzyme.  There is a food enzymes that you sprinkle in or on your food that helps break down the FODMAP. You can read more about the enzyme by going to this site: https://fodzyme.com/   I appreciate the  opportunity to care for you  Thank You   [facility]

## 2023-03-25 ENCOUNTER — Other Ambulatory Visit: Payer: Self-pay

## 2023-03-25 ENCOUNTER — Ambulatory Visit

## 2023-03-25 DIAGNOSIS — R1032 Left lower quadrant pain: Secondary | ICD-10-CM

## 2023-03-25 DIAGNOSIS — R197 Diarrhea, unspecified: Secondary | ICD-10-CM

## 2023-03-25 DIAGNOSIS — K219 Gastro-esophageal reflux disease without esophagitis: Secondary | ICD-10-CM

## 2023-03-25 LAB — TISSUE TRANSGLUTAMINASE, IGA: (tTG) Ab, IgA: <1 U/mL

## 2023-03-25 LAB — IGA: Immunoglobulin A: 313 mg/dL — ABNORMAL HIGH (ref 47–310)

## 2023-03-26 LAB — C. DIFFICILE GDH AND TOXIN A/B
GDH ANTIGEN: NOT DETECTED
MICRO NUMBER:: 16197615
SPECIMEN QUALITY:: ADEQUATE
TOXIN A AND B: NOT DETECTED

## 2023-03-30 LAB — OVA AND PARASITE EXAMINATION
CONCENTRATE RESULT:: NONE SEEN
MICRO NUMBER:: 16197321
SPECIMEN QUALITY:: ADEQUATE
TRICHROME RESULT:: NONE SEEN

## 2023-04-01 NOTE — Telephone Encounter (Signed)
 Inbound call from patient stating she was advised she had an appointment on 3/24 not 4/24. Patient is requesting a call from nurse to speak about sooner appointment. Please advise, thank you

## 2023-04-01 NOTE — Telephone Encounter (Signed)
 Scheduled OV with patient for 3/24 at 11 with [facility].

## 2023-04-01 NOTE — Telephone Encounter (Signed)
 I called and discussed with the patient, she has had slight improvement of diarrhea but still depended on Imodium slight improvement in abdominal discomfort. She has an office visit with [place] on Monday, pending how she does over the weekend if she is continuing to have to be reliant on Imodium or any abdominal discomfort on physical exam we will plan on scheduling for colonoscopy to evaluate further.

## 2023-04-05 ENCOUNTER — Ambulatory Visit: Admitting: Physician Assistant

## 2023-04-05 ENCOUNTER — Encounter: Payer: Self-pay | Admitting: Physician Assistant

## 2023-04-05 VITALS — BP 120/70 | HR 66 | Ht 66.0 in | Wt 162.0 lb

## 2023-04-05 DIAGNOSIS — K219 Gastro-esophageal reflux disease without esophagitis: Secondary | ICD-10-CM

## 2023-04-05 DIAGNOSIS — R197 Diarrhea, unspecified: Secondary | ICD-10-CM

## 2023-04-05 DIAGNOSIS — R1032 Left lower quadrant pain: Secondary | ICD-10-CM

## 2023-04-05 DIAGNOSIS — K529 Noninfective gastroenteritis and colitis, unspecified: Secondary | ICD-10-CM

## 2023-04-05 MED ORDER — SUFLAVE 178.7 G PO SOLR: 1.0000 | Freq: Once | ORAL | 0 refills | Status: AC

## 2023-04-05 NOTE — Patient Instructions (Addendum)
 You have been scheduled for a colonoscopy. Please follow written instructions given to you at your visit today.   If you use inhalers (even only as needed), please bring them with you on the day of your procedure.  DO NOT TAKE 7 DAYS PRIOR TO TEST- Trulicity (dulaglutide) Ozempic, Wegovy (semaglutide) Mounjaro (tirzepatide) Bydureon Bcise (exanatide extended release)  DO NOT TAKE 1 DAY PRIOR TO YOUR TEST Rybelsus (semaglutide) Adlyxin (lixisenatide) Victoza (liraglutide) Byetta (exanatide) ___________________________________________________________________________  Rebecca Gentry will receive your bowel preparation through [facility], which ensures the lowest copay and home delivery, with outreach via text or call from an [phone] number. Please respond promptly to avoid rescheduling of your procedure. If you are interested in alternative options or have any questions regarding your prep, please contact them at [phone] ____________________________________________________________________________  Your Provider Has Sent Your Bowel Prep Regimen To [facility]   [facility] will contact you to verify your information and collect your copay, if applicable. Enjoy the comfort of your home while your prescription is mailed to you, FREE of any shipping charges.   [facility] accepts all major insurance benefits and applies discounts & coupons.  Have additional questions?   Chat: [email or web address] Call: [phone] Email: [email or web address] NCPDP: [id number]  How will [facility] contact you?  With a Welcome phone call,  a Welcome text and a checkout link in text form.  Texts you receive from [phone] Are NOT Spam.  *To set up delivery, you must complete the checkout process via link or speak to one of the patient care representatives. If [facility] is unable to reach you, your prescription may be delayed.  To avoid long hold times on the phone, you may also utilize the secure chat  feature on the [facility] website to request that they call you back for transaction completion or to expedite your concerns. Due to recent changes in healthcare laws, you may see the results of your imaging and laboratory studies on MyChart before your provider has had a chance to review them.  We understand that in some cases there may be results that are confusing or concerning to you. Not all laboratory results come back in the same time frame and the provider may be waiting for multiple results in order to interpret others.  Please give [place] 48 hours in order for your provider to thoroughly review all the results before contacting the office for clarification of your results.  _______________________________________________________  If your blood pressure at your visit was 140/90 or greater, please contact your primary care physician to follow up on this.  _______________________________________________________  If you are age 51 or older, your body mass index should be between 23-30. Your Body mass index is 26.15 kg/m. If this is out of the aforementioned range listed, please consider follow up with your Primary Care Provider.  If you are age 40 or younger, your body mass index should be between 19-25. Your Body mass index is 26.15 kg/m. If this is out of the aformentioned range listed, please consider follow up with your Primary Care Provider.   ________________________________________________________  The [facility] providers would like to encourage you to use [facility] to communicate with providers for non-urgent requests or questions.  Due to long hold times on the telephone, sending your provider a message by [facility] may be a faster and more efficient way to get a response.  Please allow 48 business hours for a response.  Please remember that this is for non-urgent requests.  _______________________________________________________   Rebecca Gentry may have POST INFECTIOUS  IBS OR IRRITABLE  BOWEL After an infection or diverticulitis flare your intestines can spasm or be a little bit more sensitive. Try these things below:  Can do BRAT diet versus low FODMAP- see below Try trial off milk/lactose products.  Add fiber like benefiber or citracel once a day Can do trial of IBGard for AB pain EVERY DAY- Take 1-2 capsules once a day for maintence or twice a day during a flare Can take dicyclomine as needed.  if any worsening symptoms like blood in stool, weight loss, please call the office or go to the ER.    FODMAP stands for fermentable oligo-, di-, mono-saccharides and polyols (1). These are the scientific terms used to classify groups of carbs that are notorious for triggering digestive symptoms like bloating, gas and stomach pain.

## 2023-04-05 NOTE — Progress Notes (Signed)
 04/05/2023 Rebecca Gentry [id number] 24-May-1965  Referring provider: Dois Davenport, MD Primary GI doctor: Dr. Adela Lank  ASSESSMENT AND PLAN:     Diarrhea with colits Normal colon 2022 recall 10 years Negative GI pathogen panel and C. difficile 03/23/2023 CT abdomen pelvis with contrast nonspecific colitis ascending colon, no abscess or obstruction.  Diverticulosis Negative celiac, negative sed rate and CRP Given Levsin as Bentyl was not helpful She had 2 good days over Friday/Saturday but then this AM diarrhea x 2, still with mild intermittent AB pain, no fever, chills, no weight loss, no mucus, no blood Mom with possible colitis but may not be accurate, possible diverticulitis With her continuing AB pain and diarrhea this AM, possible autoimmune history in mother she would feel more comfortable with an evaluation with colonoscopy at [facility] with Dr. Adela Lank We have discussed the risks of bleeding, infection, perforation, medication reactions, and remote risk of death associated with colonoscopy. All questions were answered and the patient acknowledges these risk and wishes to proceed.  GERD Lifestyle changes discussed, avoid NSAIDS, ETOH, hand out given to the patient Continue medications  Diverticulosis Add on fiber supplement, avoid NSAIDS, information given  Mom with lupus Has had malar rash, consider testing for lupus  Patient Care Team: Dois Davenport, MD as PCP - General (Family Medicine)  HISTORY OF PRESENT ILLNESS: Discussed the use of AI scribe software for clinical note transcription with the patient, who gave verbal consent to proceed.  History of Present Illness   Rebecca Gentry is a 58 year old female who presents with ongoing gastrointestinal symptoms.  She has been experiencing ongoing gastrointestinal symptoms, including loose stools and abdominal discomfort. Over the weekend, bowel movements were normal, but she experienced loose stools at 4:30  AM and 6:30 AM on the day of the visit. The abdominal discomfort is now mild, having previously been moderate. No blood in the stool, fever, or chills, although chills were present at the onset of symptoms. She notes a slight weight loss of two to three pounds and has not noticed any mucus in the stool recently. She sometimes relies on Imodium for symptom relief.  A colonoscopy in 2022 was normal, and a recent CT scan showed nonspecific ascending colitis. Tests for GI pathogens and C. diff were negative. Sedimentation rate and CRP were also negative for general inflammation.  She recalls a rash on her cheeks that appeared recently but resolved by the next morning. No current pain, difficulty swallowing, or joint pain.  Her family history includes her mother having colitis, IBS, and diverticulitis, although her mother has early Alzheimer's, which makes communication challenging. A half-sister has thyroid disease.      She  reports that she quit smoking about 30 years ago. Her smoking use included cigarettes. She has never used smokeless tobacco. She reports that she does not drink alcohol and does not use drugs.  RELEVANT GI HISTORY, IMAGING AND LABS: Results   LABS GI pathogen panel: negative C. difficile: negative ESR: normal CRP: normal  RADIOLOGY CT scan: nonspecific ascending colitis      Wt Readings from Last 5 Encounters:  04/05/23 162 lb (73.5 kg)  03/24/23 160 lb 9.6 oz (72.8 kg)  02/13/23 161 lb (73 kg)  11/12/22 161 lb (73 kg)  09/11/22 157 lb (71.2 kg)    CBC    Component Value Date/Time   WBC 6.6 03/24/2023 0904   RBC 5.09 03/24/2023 0904   HGB 14.0 03/24/2023 0904   HCT  42.2 03/24/2023 0904   PLT 320.0 03/24/2023 0904   MCV 82.9 03/24/2023 0904   MCH 27.4 02/13/2023 0756   MCHC 33.3 03/24/2023 0904   RDW 14.0 03/24/2023 0904   LYMPHSABS 1.9 03/24/2023 0904   MONOABS 0.5 03/24/2023 0904   EOSABS 0.1 03/24/2023 0904   BASOSABS 0.1 03/24/2023 0904   Recent  Labs    02/13/23 0756 03/24/23 0904  HGB 13.6 14.0    CMP     Component Value Date/Time   NA 141 03/24/2023 0904   K 3.8 03/24/2023 0904   CL 104 03/24/2023 0904   CO2 27 03/24/2023 0904   GLUCOSE 101 (H) 03/24/2023 0904   BUN 13 03/24/2023 0904   CREATININE 0.78 03/24/2023 0904   CALCIUM 9.9 03/24/2023 0904   PROT 7.3 03/24/2023 0904   ALBUMIN 4.6 03/24/2023 0904   AST 19 03/24/2023 0904   ALT 17 03/24/2023 0904   ALKPHOS 90 03/24/2023 0904   BILITOT 0.3 03/24/2023 0904   GFRNONAA >60 02/13/2023 0756   GFRAA >60 11/19/2018 0656      Latest Ref Rng & Units 03/24/2023    9:04 AM 02/13/2023    7:56 AM 10/19/2021    6:34 AM  Hepatic Function  Total Protein 6.0 - 8.3 g/dL 7.3  7.3  6.9   Albumin 3.5 - 5.2 g/dL 4.6  4.0  3.9   AST 0 - 37 U/L 19  27  17    ALT 0 - 35 U/L 17  20  18    Alk Phosphatase 39 - 117 U/L 90  89  63   Total Bilirubin 0.2 - 1.2 mg/dL 0.3  0.3  0.4       Current Medications:    Current Outpatient Medications (Cardiovascular):    nebivolol (BYSTOLIC) 5 MG tablet, Take 5 mg by mouth daily.   rosuvastatin (CRESTOR) 10 MG tablet, Take 10 mg by mouth at bedtime.  Current Outpatient Medications (Respiratory):    albuterol (VENTOLIN HFA) 108 (90 Base) MCG/ACT inhaler, Inhale 1 puff into the lungs daily as needed.   azelastine (ASTELIN) 0.1 % nasal spray, 1-2 puff in each nostril Nasally Twice a day PRN for 30 days   fexofenadine (ALLEGRA ALLERGY) 180 MG tablet,    fluticasone (FLONASE) 50 MCG/ACT nasal spray, Place 1 spray into both nostrils 2 (two) times daily.   mometasone-formoterol (DULERA) 100-5 MCG/ACT AERO, Inhale 2 puffs into the lungs 2 (two) times daily.  Current Outpatient Medications (Analgesics):    aspirin 325 MG tablet, Take 650 mg by mouth every 4 (four) hours as needed for mild pain or headache.   ibuprofen (ADVIL) 800 MG tablet, Take 800 mg by mouth 3 (three) times daily.   Current Outpatient Medications (Other):    Ascorbic Acid  (VITAMIN C) 1000 MG tablet, Take 1,000 mg by mouth daily.   BELSOMRA 10 MG TABS, Take 1 tablet by mouth at bedtime as needed.   calcium carbonate (OS-CAL) 1250 (500 Ca) MG chewable tablet, Chew 1 tablet by mouth 2 (two) times daily.   desipramine (NORPRAMIN) 25 MG tablet, Take 25 mg by mouth daily.   diclofenac Sodium (VOLTAREN) 1 % GEL, Apply topically.   dicyclomine (BENTYL) 20 MG tablet, Take 1 tablet (20 mg total) by mouth 2 (two) times daily.   Estradiol 10 MCG TABS vaginal tablet, Place 1 tablet (10 mcg total) vaginally at bedtime. Place 1 tab nightly for two weeks then twice a week after   hydrOXYzine (ATARAX) 10 MG tablet, Take  10 mg by mouth every 4 (four) hours as needed.   hyoscyamine (LEVSIN) 0.125 MG tablet, Take 1 tablet (0.125 mg total) by mouth every 6 (six) hours as needed for cramping.   loperamide (IMODIUM) 2 MG capsule, Take 1 capsule (2 mg total) by mouth as needed for diarrhea or loose stools.   Magnesium 200 MG TABS, Take 200 mg by mouth daily.   Meclizine HCl 25 MG CHEW, Chew by mouth.   methocarbamol (ROBAXIN) 500 MG tablet, Take 1 tablet (500 mg total) by mouth every 8 (eight) hours as needed for muscle spasms.   methylcellulose (CITRUCEL) oral powder, Take 1 packet by mouth daily.   Multiple Vitamin (MULTIVITAMIN WITH MINERALS) TABS tablet, Take 1 tablet by mouth daily.   omeprazole (PRILOSEC) 20 MG capsule, Take 1 capsule (20 mg total) by mouth 2 (two) times daily before a meal. (Patient taking differently: Take 40 mg by mouth 2 (two) times daily before a meal.)   OVER THE COUNTER MEDICATION, NutriDyn 80mg  : Take one capsule twice a day   polyvinyl alcohol (LIQUIFILM TEARS) 1.4 % ophthalmic solution, Place 1 drop into both eyes as needed for dry eyes.   Probiotic Product (ALIGN) 4 MG CAPS, Take 1 capsule by mouth daily.   SUFLAVE 178.7 g SOLR, Take 1 kit by mouth once for 1 dose.   VITAMIN D PO, Take by mouth.  Medical History:  Past Medical History:  Diagnosis  Date   Allergy    Anal fissure    Anemia    Anxiety    Arthritis    Asthma    Depression    Diverticulosis    Fibromyalgia    GERD (gastroesophageal reflux disease)    HTN (hypertension)    Hyperlipidemia 2023   Irritable bowel syndrome    Sinus arrhythmia    Sleep apnea    Allergies:  Allergies  Allergen Reactions   Penicillin G     Other reaction(s): Other (See Comments) hives   Apple Swelling   Other     Ingredient in Sun screen- She uses Baby Sunscreen with no issues      Surgical History:  She  has a past surgical history that includes Abdominal hysterectomy; LEEP; Colonoscopy; and Upper gastrointestinal endoscopy (2015). Family History:  Her family history includes Breast cancer in her mother and paternal aunt; Colon polyps in her mother; Heart disease in her mother; Irritable bowel syndrome in her mother; Kidney disease in her mother; Lung cancer in her brother; Other in her father.  REVIEW OF SYSTEMS  : All other systems reviewed and negative except where noted in the History of Present Illness.  PHYSICAL EXAM: BP 120/70 (BP Location: Right Arm, Patient Position: Sitting, Cuff Size: Normal)   Pulse 66   Ht 5\' 6"  (1.676 m)   Wt 162 lb (73.5 kg)   BMI 26.15 kg/m  Physical Exam   GENERAL APPEARANCE: Well nourished, in no apparent distress. HEENT: No cervical lymphadenopathy, unremarkable thyroid, sclerae anicteric, conjunctiva pink. RESPIRATORY: Respiratory effort normal, BS equal bilateral without rales, rhonchi, wheezing. CARDIO: RRR with no MRGs, peripheral pulses intact. ABDOMEN: Soft, non distended, active bowel sounds in all 4 quadrants, non-tender to deep palpation, no rebound, no mass appreciated. RECTAL: Declines. MUSCULOSKELETAL: Full ROM, normal gait, without edema. SKIN: Dry, intact without rashes or lesions. No jaundice. NEURO: Alert, oriented, no focal deficits. PSYCH: Cooperative, normal mood and affect.      Doree Albee, PA-C 11:48  AM

## 2023-05-05 ENCOUNTER — Telehealth: Payer: Self-pay | Admitting: Physician Assistant

## 2023-05-05 NOTE — Telephone Encounter (Signed)
 Patient called and stated that she has procedure coming up and she received her prep medication but her instruction say she is suppose to have 2 packet and patient stated that she has 1 packet. Patient also stated that if she does not answer to please leave her a detailed message and she will return your call as soon as possible. Patient is requesting a call back regarding going over her prep medication. Please advise.

## 2023-05-06 ENCOUNTER — Ambulatory Visit: Admitting: Physician Assistant

## 2023-05-10 NOTE — Telephone Encounter (Signed)
 Left message for patient to call office.

## 2023-05-11 NOTE — Telephone Encounter (Signed)
 Patient returned call, please advise.   Patient states she works 12-6 and will not be able to answer the phone during that time.

## 2023-05-11 NOTE — Telephone Encounter (Signed)
 Spoke with patient and she has everything they need for her prep.

## 2023-05-14 ENCOUNTER — Ambulatory Visit (AMBULATORY_SURGERY_CENTER): Admitting: Gastroenterology

## 2023-05-14 ENCOUNTER — Encounter: Payer: Self-pay | Admitting: Gastroenterology

## 2023-05-14 VITALS — BP 135/70 | HR 63 | Temp 97.3°F | Resp 12 | Ht 66.0 in | Wt 162.0 lb

## 2023-05-14 DIAGNOSIS — R197 Diarrhea, unspecified: Secondary | ICD-10-CM | POA: Diagnosis not present

## 2023-05-14 DIAGNOSIS — K648 Other hemorrhoids: Secondary | ICD-10-CM

## 2023-05-14 DIAGNOSIS — R933 Abnormal findings on diagnostic imaging of other parts of digestive tract: Secondary | ICD-10-CM

## 2023-05-14 DIAGNOSIS — K5730 Diverticulosis of large intestine without perforation or abscess without bleeding: Secondary | ICD-10-CM

## 2023-05-14 MED ORDER — SODIUM CHLORIDE 0.9 % IV SOLN
4.0000 mg | Freq: Once | INTRAVENOUS | Status: AC
  Administered 2023-05-14: 4 mg via INTRAVENOUS

## 2023-05-14 MED ORDER — SODIUM CHLORIDE 0.9 % IV SOLN: 500.0000 mL | Freq: Once | INTRAVENOUS | Status: DC

## 2023-05-14 NOTE — Patient Instructions (Signed)

## 2023-05-14 NOTE — Op Note (Signed)
 [facility] Patient Name: Rebecca Gentry Procedure Date: 05/14/2023 7:34 AM MRN: [id number] Endoscopist: Landon Pinion P. General Kenner , MD, [id number] Age: 58 Referring MD:  Date of Birth: 24-Aug-1965 Gender: Female Account #: [id number] Procedure:                Colonoscopy Indications:              Clinically significant diarrhea of unexplained                            origin, Abnormal CT of the GI tract - right sided                            colonic thickening - diarrhea lasted a few months,                            has slowly improved recently Medicines:                Monitored Anesthesia Care Procedure:                Pre-Anesthesia Assessment:                           - Prior to the procedure, a History and Physical                            was performed, and patient medications and                            allergies were reviewed. The patient's tolerance of                            previous anesthesia was also reviewed. The risks                            and benefits of the procedure and the sedation                            options and risks were discussed with the patient.                            All questions were answered, and informed consent                            was obtained. Prior Anticoagulants: The patient has                            taken no anticoagulant or antiplatelet agents. ASA                            Grade Assessment: II - A patient with mild systemic                            disease. After reviewing the risks and benefits,  the patient was deemed in satisfactory condition to                            undergo the procedure.                           After obtaining informed consent, the colonoscope                            was passed under direct vision. Throughout the                            procedure, the patient's blood pressure, pulse, and                            oxygen saturations were  monitored continuously. The                            [id number]-HQ190L Colonoscope [id number] was introduced                            through the anus and advanced to the the terminal                            ileum, with identification of the appendiceal                            orifice and IC valve. The colonoscopy was performed                            without difficulty. The patient tolerated the                            procedure well. The quality of the bowel                            preparation was adequate. The terminal ileum,                            ileocecal valve, appendiceal orifice, and rectum                            were photographed. Scope In: 8:04:29 AM Scope Out: 8:19:46 AM Scope Withdrawal Time: 0 hours 10 minutes 40 seconds  Total Procedure Duration: 0 hours 15 minutes 17 seconds  Findings:                 The perianal and digital rectal examinations were                            normal.                           The terminal ileum appeared normal.  Multiple small-mouthed diverticula were found in                            the transverse colon and left colon.                           Internal hemorrhoids were found during                            retroflexion. The hemorrhoids were small.                           The exam was otherwise without abnormality. NO                            pathology noted in the right colon in regards to CT                            scan findings.                           Biopsies for histology were taken with a cold                            forceps from the right colon, left colon and                            transverse colon for evaluation of microscopic                            colitis. Complications:            No immediate complications. Estimated blood loss:                            Minimal. Estimated Blood Loss:     Estimated blood loss was minimal. Impression:               - The  examined portion of the ileum was normal.                           - Diverticulosis in the transverse colon and in the                            left colon.                           - Internal hemorrhoids.                           - The examination was otherwise normal.                           - Biopsies were taken with a cold forceps from the  right colon, left colon and transverse colon for                            evaluation of microscopic colitis.                           Suspect patient may have had infectious colitis                            which has since improved with time (although prior                            stool testing negative) Recommendation:           - Patient has a contact number available for                            emergencies. The signs and symptoms of potential                            delayed complications were discussed with the                            patient. Return to normal activities tomorrow.                            Written discharge instructions were provided to the                            patient.                           - Resume previous diet.                           - Continue present medications.                           - Await pathology results and course Lendon Queen. Johnryan Sao, MD 05/14/2023 8:24:13 AM This report has been signed electronically.

## 2023-05-14 NOTE — Progress Notes (Signed)
 Pt's states no medical or surgical changes since previsit or office visit.

## 2023-05-14 NOTE — Progress Notes (Signed)
 Patient complaining of nausea, verbal order obtained from Dr. General Kenner for 4mg  Zofran  IV. Patient is feeling well and denies nausea at time of discharge.

## 2023-05-14 NOTE — Progress Notes (Signed)
 [facility] History and Physical   Primary Care Physician:  Allene Ivan, MD   Reason for Procedure:   Abnormal CT scan colon, diarrhea  Plan:    colonoscopy     HPI: Rebecca Gentry is a 58 y.o. female  here for colonoscopy to evaluate diarrhea / abnormal CT scan.  CT 3/11 showed R sided colitis, patients has had some diarrhea and pain, negative infectious workup. Last colonoscopy 2022 was normal. She is feeling better, bowels improved but not yet normal, still having some loose stools.   Otherwise feels well without any cardiopulmonary symptoms.   I have discussed risks / benefits of anesthesia and endoscopic procedure with Lon Risser and they wish to proceed with the exams as outlined today.    Past Medical History:  Diagnosis Date   Allergy    Anal fissure    Anemia    Anxiety    Arthritis    Asthma    Depression    Diverticulosis    Fibromyalgia    GERD (gastroesophageal reflux disease)    HTN (hypertension)    Hyperlipidemia 2023   Irritable bowel syndrome    Sinus arrhythmia    Sleep apnea     Past Surgical History:  Procedure Laterality Date   ABDOMINAL HYSTERECTOMY     partial   COLONOSCOPY     LEEP     UPPER GASTROINTESTINAL ENDOSCOPY  2015    Prior to Admission medications   Medication Sig Start Date End Date Taking? Authorizing Provider  albuterol (VENTOLIN HFA) 108 (90 Base) MCG/ACT inhaler Inhale 1 puff into the lungs daily as needed. 01/28/18  Yes [provider]  Ascorbic Acid (VITAMIN C) 1000 MG tablet Take 1,000 mg by mouth daily.   Yes [provider]  azelastine (ASTELIN) 0.1 % nasal spray 1-2 puff in each nostril Nasally Twice a day PRN for 30 days   Yes [provider]  BELSOMRA 10 MG TABS Take 1 tablet by mouth at bedtime as needed. 10/29/20  Yes [provider]  calcium carbonate (OS-CAL) 1250 (500 Ca) MG chewable tablet Chew 1 tablet by mouth 2 (two) times daily.   Yes [provider]  desipramine (NORPRAMIN) 25 MG tablet Take 25 mg by mouth daily.   Yes [provider]  dicyclomine  (BENTYL ) 20 MG tablet Take 1 tablet (20 mg total) by mouth 2 (two) times daily. 02/13/23  Yes Mordecai Applebaum, MD  Estradiol  10 MCG TABS vaginal tablet Place 1 tablet (10 mcg total) vaginally at bedtime. Place 1 tab nightly for two weeks then twice a week after 01/18/23  Yes Zuleta, Kaitlin G, NP  fexofenadine (ALLEGRA ALLERGY) 180 MG tablet  06/12/20  Yes [provider]  fluticasone (FLONASE) 50 MCG/ACT nasal spray Place 1 spray into both nostrils 2 (two) times daily.   Yes [provider]  loperamide  (IMODIUM ) 2 MG capsule Take 1 capsule (2 mg total) by mouth as needed for diarrhea or loose stools. 10/19/21  Yes Redwine, Madison A, PA-C  Magnesium 200 MG TABS Take 200 mg by mouth daily.   Yes [provider]  methocarbamol  (ROBAXIN ) 500 MG tablet Take 1 tablet (500 mg total) by mouth every 8 (eight) hours as needed for muscle spasms. 10/11/20  Yes Mozell Arias, MD  methylcellulose (CITRUCEL) oral powder Take 1 packet by mouth daily. 08/13/22  Yes Gena Laski, Lendon Queen, MD  mometasone-formoterol (DULERA) 100-5 MCG/ACT AERO Inhale 2 puffs into the lungs 2 (two) times daily.  Yes [provider]  Multiple Vitamin (MULTIVITAMIN WITH MINERALS) TABS tablet Take 1 tablet by mouth daily.   Yes [provider]  nebivolol (BYSTOLIC) 5 MG tablet Take 5 mg by mouth daily.   Yes [provider]  omeprazole  (PRILOSEC) 20 MG capsule Take 1 capsule (20 mg total) by mouth 2 (two) times daily before a meal. Patient taking differently: Take 40 mg by mouth 2 (two) times daily before a meal. 08/13/22  Yes Virgel Haro, Lendon Queen, MD  OVER THE COUNTER MEDICATION NutriDyn 80mg  : Take one capsule twice a day   Yes [provider]  polyvinyl alcohol (LIQUIFILM TEARS) 1.4 % ophthalmic solution Place 1 drop into both eyes as needed for dry eyes.    Yes [provider]  Probiotic Product (ALIGN) 4 MG CAPS Take 1 capsule by mouth daily.   Yes [provider]  rosuvastatin (CRESTOR) 10 MG tablet Take 10 mg by mouth at bedtime. 04/07/22  Yes [provider]  VITAMIN D PO Take by mouth.   Yes [provider]  aspirin  325 MG tablet Take 650 mg by mouth every 4 (four) hours as needed for mild pain or headache.    [provider]  diclofenac Sodium (VOLTAREN) 1 % GEL Apply topically. 08/28/20   [provider]  hydrOXYzine (ATARAX) 10 MG tablet Take 10 mg by mouth every 4 (four) hours as needed. 03/16/23   [provider]  hyoscyamine  (LEVSIN) 0.125 MG tablet Take 1 tablet (0.125 mg total) by mouth every 6 (six) hours as needed for cramping. 03/24/23   Edmonia Gottron, PA-C  ibuprofen (ADVIL) 800 MG tablet Take 800 mg by mouth 3 (three) times daily. 09/17/20   [provider]  Meclizine HCl 25 MG CHEW Chew by mouth. 01/28/18   [provider]    Current Outpatient Medications  Medication Sig Dispense Refill   albuterol (VENTOLIN HFA) 108 (90 Base) MCG/ACT inhaler Inhale 1 puff into the lungs daily as needed.     Ascorbic Acid (VITAMIN C) 1000 MG tablet Take 1,000 mg by mouth daily.     azelastine (ASTELIN) 0.1 % nasal spray 1-2 puff in each nostril Nasally Twice a day PRN for 30 days     BELSOMRA 10 MG TABS Take 1 tablet by mouth at bedtime as needed.     calcium carbonate (OS-CAL) 1250 (500 Ca) MG chewable tablet Chew 1 tablet by mouth 2 (two) times daily.     desipramine (NORPRAMIN) 25 MG tablet Take 25 mg by mouth daily.     dicyclomine  (BENTYL ) 20 MG tablet Take 1 tablet (20 mg total) by mouth 2 (two) times daily. 20 tablet 0   Estradiol  10 MCG TABS vaginal tablet Place 1 tablet (10 mcg total) vaginally at bedtime. Place 1 tab nightly for two weeks then twice a week after 18 tablet 5   fexofenadine (ALLEGRA ALLERGY) 180 MG tablet      fluticasone (FLONASE) 50 MCG/ACT  nasal spray Place 1 spray into both nostrils 2 (two) times daily.     loperamide  (IMODIUM ) 2 MG capsule Take 1 capsule (2 mg total) by mouth as needed for diarrhea or loose stools. 30 capsule 0   Magnesium 200 MG TABS Take 200 mg by mouth daily.     methocarbamol  (ROBAXIN ) 500 MG tablet Take 1 tablet (500 mg total) by mouth every 8 (eight) hours as needed for muscle spasms. 8 tablet 0   methylcellulose (CITRUCEL) oral powder Take 1 packet  by mouth daily.     mometasone-formoterol (DULERA) 100-5 MCG/ACT AERO Inhale 2 puffs into the lungs 2 (two) times daily.     Multiple Vitamin (MULTIVITAMIN WITH MINERALS) TABS tablet Take 1 tablet by mouth daily.     nebivolol (BYSTOLIC) 5 MG tablet Take 5 mg by mouth daily.     omeprazole  (PRILOSEC) 20 MG capsule Take 1 capsule (20 mg total) by mouth 2 (two) times daily before a meal. (Patient taking differently: Take 40 mg by mouth 2 (two) times daily before a meal.) 60 capsule 3   OVER THE COUNTER MEDICATION NutriDyn 80mg  : Take one capsule twice a day     polyvinyl alcohol (LIQUIFILM TEARS) 1.4 % ophthalmic solution Place 1 drop into both eyes as needed for dry eyes.     Probiotic Product (ALIGN) 4 MG CAPS Take 1 capsule by mouth daily.     rosuvastatin (CRESTOR) 10 MG tablet Take 10 mg by mouth at bedtime.     VITAMIN D PO Take by mouth.     aspirin  325 MG tablet Take 650 mg by mouth every 4 (four) hours as needed for mild pain or headache.     diclofenac Sodium (VOLTAREN) 1 % GEL Apply topically.     hydrOXYzine (ATARAX) 10 MG tablet Take 10 mg by mouth every 4 (four) hours as needed.     hyoscyamine  (LEVSIN) 0.125 MG tablet Take 1 tablet (0.125 mg total) by mouth every 6 (six) hours as needed for cramping. 60 tablet 0   ibuprofen (ADVIL) 800 MG tablet Take 800 mg by mouth 3 (three) times daily.     Meclizine HCl 25 MG CHEW Chew by mouth.     Current Facility-Administered Medications  Medication Dose Route Frequency Provider Last Rate Last Admin   0.9 %   sodium chloride  infusion  500 mL Intravenous Once Rondy Krupinski, Lendon Queen, MD        Allergies as of 05/14/2023 - Review Complete 05/14/2023  Allergen Reaction Noted   Apple Swelling 01/28/2018   Other Hives 10/24/2020   Penicillin g  01/28/2018    Family History  Problem Relation Age of Onset   Colon polyps Mother    Breast cancer Mother    Heart disease Mother    Irritable bowel syndrome Mother    Kidney disease Mother    Other Father        bowel obstruction/ sepis   Lung cancer Brother        smoker   Breast cancer Paternal Aunt    Esophageal cancer Neg Hx    Colon cancer Neg Hx    Rectal cancer Neg Hx    Stomach cancer Neg Hx     Social History   Socioeconomic History   Marital status: Married    Spouse name: Tameaka Cannoy   Number of children: 0   Years of education: Not on file   Highest education level: Not on file  Occupational History   Occupation: Building surveyor  Tobacco Use   Smoking status: Former    Current packs/day: 0.00    Types: Cigarettes    Quit date: 1995    Years since quitting: 30.3   Smokeless tobacco: Never  Vaping Use   Vaping status: Never Used  Substance and Sexual Activity   Alcohol use: Never   Drug use: Never   Sexual activity: Yes    Birth control/protection: Surgical  Other Topics Concern   Not on file  Social History Narrative  Not on file   Social Drivers of Health   Financial Resource Strain: Not on file  Food Insecurity: Not on file  Transportation Needs: Not on file  Physical Activity: Not on file  Stress: Not on file  Social Connections: Not on file  Intimate Partner Violence: Not on file    Review of Systems: All other review of systems negative except as mentioned in the HPI.  Physical Exam: Vital signs BP (!) 149/72   Pulse (!) 52   Temp (!) 97.3 F (36.3 C) (Skin)   Ht 5\' 6"  (1.676 m)   Wt 162 lb (73.5 kg)   SpO2 100%   BMI 26.15 kg/m   General:   Alert,  Well-developed, pleasant and  cooperative in NAD Lungs:  Clear throughout to auscultation.   Heart:  Regular rate and rhythm Abdomen:  Soft, nontender and nondistended.   Neuro/Psych:  Alert and cooperative. Normal mood and affect. A and O x 3  Christi Coward, MD [facility]

## 2023-05-14 NOTE — Progress Notes (Signed)
 Called to room to assist during endoscopic procedure.  Patient ID and intended procedure confirmed with present staff. Received instructions for my participation in the procedure from the performing physician.

## 2023-05-14 NOTE — Progress Notes (Signed)
To pacu, VSS.report to Rn.tb 

## 2023-05-17 ENCOUNTER — Telehealth: Payer: Self-pay

## 2023-05-17 NOTE — Telephone Encounter (Signed)
  Follow up Call-     05/14/2023    7:10 AM 09/11/2022    9:08 AM 11/07/2020    9:57 AM  Call back number  Post procedure Call Back phone  # [phone]  Permission to leave phone message Yes Yes Yes     Patient questions:  Do you have a fever, pain , or abdominal swelling? No. Pain Score  0 *  Have you tolerated food without any problems? Yes.    Have you been able to return to your normal activities? Yes.    Do you have any questions about your discharge instructions: Diet   No. Medications  No. Follow up visit  No.  Do you have questions or concerns about your Care? No.  Actions: * If pain score is 4 or above: No action needed, pain <4.

## 2023-05-18 LAB — SURGICAL PATHOLOGY

## 2023-05-20 ENCOUNTER — Encounter: Payer: Self-pay | Admitting: Gastroenterology

## 2023-06-29 ENCOUNTER — Other Ambulatory Visit: Payer: Self-pay | Admitting: Family Medicine

## 2023-06-29 ENCOUNTER — Other Ambulatory Visit: Payer: Self-pay

## 2023-06-29 ENCOUNTER — Ambulatory Visit: Attending: Obstetrics and Gynecology

## 2023-06-29 DIAGNOSIS — R279 Unspecified lack of coordination: Secondary | ICD-10-CM | POA: Diagnosis present

## 2023-06-29 DIAGNOSIS — R293 Abnormal posture: Secondary | ICD-10-CM | POA: Diagnosis present

## 2023-06-29 DIAGNOSIS — M6281 Muscle weakness (generalized): Secondary | ICD-10-CM | POA: Diagnosis present

## 2023-06-29 DIAGNOSIS — Z1231 Encounter for screening mammogram for malignant neoplasm of breast: Secondary | ICD-10-CM

## 2023-06-29 NOTE — Patient Instructions (Signed)
Urge Incontinence  Ideal urination frequency is every 2-4 wakeful hours, which equates to 5-8 times within a 24-hour period.   Urge incontinence is leakage that occurs when the bladder muscle contracts, creating a sudden need to go before getting to the bathroom.   Going too often when your bladder isn't actually full can disrupt the body's automatic signals to store and hold urine longer, which will increase urgency/frequency.  In this case, the bladder "is running the show" and strategies can be learned to retrain this pattern.   One should be able to control the first urge to urinate, at around 150mL.  The bladder can hold up to a "grande latte," or 400mL. To help you gain control, practice the Urge Drill below when urgency strikes.  This drill will help retrain your bladder signals and allow you to store and hold urine longer.  The overall goal is to stretch out your time between voids to reach a more manageable voiding schedule.    Practice your "quick flicks" often throughout the day (each waking hour) even when you don't need feel the urge to go.  This will help strengthen your pelvic floor muscles, making them more effective in controlling leakage.  Urge Drill  When you feel an urge to go, follow these steps to regain control: Stop what you are doing and be still Take one deep breath, directing your air into your abdomen Think an affirming thought, such as "I've got this." Do 5 quick flicks of your pelvic floor Walk with control to the bathroom to void, or delay voiding        The knack: Use this technique while coughing, laughing, sneezing, or with any activities that causes you to leak urine a little. Right before you perform one of these activities that increase pressure in the abdomen and pushes a little urine out, perform a pelvic floor muscle contraction and hold. If that does not completely stop the leaking, try tightening your thighs together in addition to performing a  pelvic floor muscle contraction. Make sure you are not trying to stifle a cough, sneeze, or laugh; allow these activities in full as it will cause less pressure down into the bladder and pelvic floor muscles.       [facility] [address], [address] [place], [place] [address] Phone # [phone] Fax [phone]  

## 2023-06-29 NOTE — Therapy (Signed)
 OUTPATIENT PHYSICAL THERAPY FEMALE PELVIC EVALUATION   Patient Name: Rebecca Gentry MRN: [id number] DOB:16-May-1965, 58 y.o., female Today's Date: 06/29/2023  END OF SESSION:  PT End of Session - 06/29/23 0802     Visit Number 1    Date for PT Re-Evaluation 12/14/23    Authorization Type [employer]    PT Start Time 0800    PT Stop Time 0840    PT Time Calculation (min) 40 min    Activity Tolerance Patient tolerated treatment well    Behavior During Therapy WFL for tasks assessed/performed          Past Medical History:  Diagnosis Date   Allergy    Anal fissure    Anemia    Anxiety    Arthritis    Asthma    Depression    Diverticulosis    Fibromyalgia    GERD (gastroesophageal reflux disease)    HTN (hypertension)    Hyperlipidemia 2023   Irritable bowel syndrome    Sinus arrhythmia    Sleep apnea    Past Surgical History:  Procedure Laterality Date   ABDOMINAL HYSTERECTOMY     partial   COLONOSCOPY     LEEP     UPPER GASTROINTESTINAL ENDOSCOPY  2015   There are no active problems to display for this patient.   PCP: Allene Ivan, MD  REFERRING PROVIDER: Edmonia Gottron, PA-C   REFERRING DIAG: R19.7 (ICD-10-CM) - Diarrhea, unspecified type K52.9 (ICD-10-CM) - Noninfectious gastroenteritis, unspecified type R10.32 (ICD-10-CM) - LLQ pain K21.9 (ICD-10-CM) - Gastroesophageal reflux disease, unspecified whether esophagitis present  THERAPY DIAG:  Muscle weakness (generalized) - Plan: PT plan of care cert/re-cert  Unspecified lack of coordination - Plan: PT plan of care cert/re-cert  Abnormal posture - Plan: PT plan of care cert/re-cert  Rationale for Evaluation and Treatment: Rehabilitation  ONSET DATE: August 2024  SUBJECTIVE:                                                                                                                                                                                           SUBJECTIVE STATEMENT: Pt states that  she had urethral bulking in January and it was recommended that she also perform PT. She is also doing estradiol  tablets 2x/week to help.    PAIN:  Are you having pain? No   PRECAUTIONS: None  RED FLAGS: None   WEIGHT BEARING RESTRICTIONS: No  FALLS:  Has patient fallen in last 6 months? No  OCCUPATION: daycare Data processing manager  ACTIVITY LEVEL : not currently   PLOF: Independent  PATIENT GOALS: additional support and preventative help with bladder leakage  PERTINENT HISTORY:  Abdominal hysterectomy (partial), urethral bulking procedure, fibromyalgia    BOWEL MOVEMENT: Pain with bowel movement: No Type of bowel movement:Frequency 1x/day and Strain no Fully empty rectum: Yes:   Leakage: No Pads: No Fiber supplement/laxative No  URINATION: Pain with urination: No Fully empty bladder: Yes: but will have some post void dribbling  Stream: Strong Urgency: Yes - when coming home Frequency: every 2.5 hours; occasionally waking at night to urinate  Fluid Intake: not enough water; drinks a lot of gatorade  Leakage: Coughing, Sneezing, and Laughing Pads: Yes: typically one panty liner in a day   INTERCOURSE:  Ability to have vaginal penetration Yes  Pain with intercourse: occasionally DrynessYes - been on estradiol  since December  Climax: WNL Marinoff Scale: 1/3 Lubricant: sometimes   PREGNANCY: No children  PROLAPSE: None   OBJECTIVE:  Note: Objective measures were completed at Evaluation unless otherwise noted.   PATIENT SURVEYS:   PFIQ-7: 24  COGNITION: Overall cognitive status: Within functional limits for tasks assessed     SENSATION: Light touch: Appears intact   FUNCTIONAL TESTS:  Squat: bil valgus knee collapse Single leg stance:  Rt: pelvic drop  Lt: pelvic drop  Curl-up test: no distortion but significant difficulty   GAIT: Assistive device utilized: None Comments: WNL  POSTURE: rounded shoulders, forward head, increased lumbar  lordosis, increased thoracic kyphosis, and anterior pelvic tilt, Rt upper/Lt lower curvature with Lt pelvic rotation and Lt posterior innominate rotation   LUMBARAROM/PROM:  A/PROM A/PROM  Eval (% available)  Flexion 50, low back pain  Extension 25  Right lateral flexion 50  Left lateral flexion 50  Right rotation 75  Left rotation 25   (Blank rows = not tested)  PALPATION:   General: some tightness throughout thoracic/lumbar paraspinals   Pelvic Alignment: Lt posterior rotation; overall rotation of pelvis to the Lt  Abdominal: good mobility; apical breathing pattern                External Perineal Exam: dryness, phimosis, labial fusion                             Internal Pelvic Floor: dry, vaginal stenosis, some tenderness/stinging on Lt  Patient confirms identification and approves PT to assess internal pelvic floor and treatment Yes  PELVIC MMT:   MMT eval  Vaginal 2/5, tendency to bear down and had difficulty consistently drawing in; 4 second hold, 4 repeat contractions   Diastasis Recti WNL  (Blank rows = not tested)        TONE: low  PROLAPSE: WNL  TODAY'S TREATMENT:                                                                                                                              DATE:  05/19/23  EVAL  Neuromuscular re-education: Pt provides verbal consent for internal vaginal/rectal pelvic floor exam. Internal vaginal pelvic floor  muscle contraction training Quick flicks Long holds The knack Urge drill Therapeutic activities: Lubricants Vaginal moisturizers    PATIENT EDUCATION:  Education details: See above Person educated: Patient Education method: Explanation, Demonstration, Tactile cues, Verbal cues, and Handouts Education comprehension: verbalized understanding  HOME EXERCISE PROGRAM: [id number]  ASSESSMENT:  CLINICAL IMPRESSION: Patient is a 58 y.o. female who was seen today for physical therapy evaluation and treatment for  urinary incontinence. Exam findings notable for decreased lumbar A/ROM, abnormal posture, functional hip weakness with bil valgus knee collapse in squat, unable to perform single leg stance and had pelvic drop bil, difficulty with curl-up test due to abdominal weakness, vulvar dryness and phimosis, pelvic floor muscle weakness and decreased endurance, poor coordination with pelvic floor muscle contraction. Signs and symptoms are most consistent with low pelvic floor muscle tone, vulvovaginal dryness, and pelvic floor muscle weakness/poor coordination. Initial treatment consisted of pelvic floor muscle contraction training, the knack, urge drill, lubricants, and vaginal moisturizers to use on regular basis. She will continue to benefit from skilled PT intervention in order to decrease urinary incontinence, address all impairments, and progress functional strengthening program.   OBJECTIVE IMPAIRMENTS: decreased activity tolerance, decreased coordination, decreased endurance, decreased mobility, decreased ROM, decreased strength, increased fascial restrictions, increased muscle spasms, impaired flexibility, impaired tone, improper body mechanics, postural dysfunction, and pain.   ACTIVITY LIMITATIONS: continence  PARTICIPATION LIMITATIONS: community activity and occupation  PERSONAL FACTORS: 1 comorbidity: medical history are also affecting patient's functional outcome.   REHAB POTENTIAL: Good  CLINICAL DECISION MAKING: Stable/uncomplicated  EVALUATION COMPLEXITY: Low   GOALS: Goals reviewed with patient? Yes  SHORT TERM GOALS: Target date: 07/27/2023   Pt will be independent with HEP.   Baseline: Goal status: INITIAL  2.  Pt will report 25% improvement in urinary incontinence in order to prevent any unwanted odors.  Baseline:  Goal status: INITIAL  3.  Pt will be independent with the knack, urge suppression technique, and double voiding in order to improve bladder habits and decrease  urinary incontinence.   Baseline:  Goal status: INITIAL  4.  Pt will increase all impaired lumbar A/ROM by 25% without pain in order to allow for better pelvic floor muscle strengthening.  Baseline:  Goal status: INITIAL   LONG TERM GOALS: Target date: 12/14/23  Pt will be independent with advanced HEP.   Baseline:  Goal status: INITIAL  2.  Pt will report 75% improvement in urinary incontinence in order to prevent any unwanted odors. Baseline:  Goal status: INITIAL  3.  Pt will report no leaks with laughing, coughing, sneezing in order to improve comfort with interpersonal relationships and community activities.   Baseline:  Goal status: INITIAL  4.  Pt will be able to perform squat without bil valgus knee collapse in order to demonstrate improved hip/core strength that will prevent worsening urinary incontinence.  Baseline:  Goal status: INITIAL  5.  Pt will be able to perform single leg stance without pelvic drop bil with UE support in order to demonstrate improved core strength that will prevent low back injury/pain.  Baseline:  Goal status: INITIAL   PLAN:  PT FREQUENCY: 1-2x/week  PT DURATION: 6 months  PLANNED INTERVENTIONS: 97110-Therapeutic exercises, 97530- Therapeutic activity, [id number]- Neuromuscular re-education, 97535- Self Care, 40981- Manual therapy, Dry Needling, and Biofeedback  PLAN FOR NEXT SESSION: Begin core training. Mobility activities. Double voiding.   Verlena Glenn, PT, DPT05/07/251:17 PM

## 2023-07-06 ENCOUNTER — Ambulatory Visit

## 2023-07-06 DIAGNOSIS — M6281 Muscle weakness (generalized): Secondary | ICD-10-CM

## 2023-07-06 DIAGNOSIS — R293 Abnormal posture: Secondary | ICD-10-CM

## 2023-07-06 DIAGNOSIS — R279 Unspecified lack of coordination: Secondary | ICD-10-CM

## 2023-07-06 NOTE — Patient Instructions (Signed)
Double-voiding:  This technique is to help with post-void dribbling, or leaking a little bit when you stand up right after urinating.  Use relaxed toileting mechanics to urinate as much as you feel like you have to without straining.  Sit back upright from leaning forward and relax this way for 10-20 seconds.  Lean forward again to finish voiding any amount more.     [facility] [address] [place], [place] [address] Phone # [phone] Fax [phone]

## 2023-07-06 NOTE — Therapy (Signed)
 OUTPATIENT PHYSICAL THERAPY FEMALE PELVIC TREATMENT   Patient Name: Rebecca Gentry MRN: [id number] DOB:10-21-1965, 58 y.o., female Today's Date: 07/06/2023  END OF SESSION:  PT End of Session - 07/06/23 0850     Visit Number 2    Date for PT Re-Evaluation 12/14/23    Authorization Type BC[employer]    PT Start Time 0845    PT Stop Time 0925    PT Time Calculation (min) 40 min    Activity Tolerance Patient tolerated treatment well    Behavior During Therapy WFL for tasks assessed/performed           Past Medical History:  Diagnosis Date   Allergy    Anal fissure    Anemia    Anxiety    Arthritis    Asthma    Depression    Diverticulosis    Fibromyalgia    GERD (gastroesophageal reflux disease)    HTN (hypertension)    Hyperlipidemia 2023   Irritable bowel syndrome    Sinus arrhythmia    Sleep apnea    Past Surgical History:  Procedure Laterality Date   ABDOMINAL HYSTERECTOMY     partial   COLONOSCOPY     LEEP     UPPER GASTROINTESTINAL ENDOSCOPY  2015   There are no active problems to display for this patient.   PCP: Burney Darice CROME, MD  REFERRING PROVIDER: Craig Alan SAUNDERS, PA-C   REFERRING DIAG: R19.7 (ICD-10-CM) - Diarrhea, unspecified type K52.9 (ICD-10-CM) - Noninfectious gastroenteritis, unspecified type R10.32 (ICD-10-CM) - LLQ pain K21.9 (ICD-10-CM) - Gastroesophageal reflux disease, unspecified whether esophagitis present  THERAPY DIAG:  Muscle weakness (generalized)  Unspecified lack of coordination  Abnormal posture  Rationale for Evaluation and Treatment: Rehabilitation  ONSET DATE: August 2024  SUBJECTIVE:                                                                                                                                                                                           SUBJECTIVE STATEMENT: Pt was able to practice the urge drill and the knack and they were both helpful. She has been working on exercises  consistently.    PAIN:  Are you having pain? No   PRECAUTIONS: None  RED FLAGS: None   WEIGHT BEARING RESTRICTIONS: No  FALLS:  Has patient fallen in last 6 months? No  OCCUPATION: daycare Data processing manager  ACTIVITY LEVEL : not currently   PLOF: Independent  PATIENT GOALS: additional support and preventative help with bladder leakage  PERTINENT HISTORY:  Abdominal hysterectomy (partial), urethral bulking procedure, fibromyalgia    BOWEL MOVEMENT: Pain with bowel movement: No Type of bowel  movement:Frequency 1x/day and Strain no Fully empty rectum: Yes:   Leakage: No Pads: No Fiber supplement/laxative No  URINATION: Pain with urination: No Fully empty bladder: Yes: but will have some post void dribbling  Stream: Strong Urgency: Yes - when coming home Frequency: every 2.5 hours; occasionally waking at night to urinate  Fluid Intake: not enough water; drinks a lot of gatorade  Leakage: Coughing, Sneezing, and Laughing Pads: Yes: typically one panty liner in a day   INTERCOURSE:  Ability to have vaginal penetration Yes  Pain with intercourse: occasionally DrynessYes - been on estradiol  since December  Climax: WNL Marinoff Scale: 1/3 Lubricant: sometimes   PREGNANCY: No children  PROLAPSE: None   OBJECTIVE:  Note: Objective measures were completed at Evaluation unless otherwise noted.   PATIENT SURVEYS:   PFIQ-7: 24  COGNITION: Overall cognitive status: Within functional limits for tasks assessed     SENSATION: Light touch: Appears intact   FUNCTIONAL TESTS:  Squat: bil valgus knee collapse Single leg stance:  Rt: pelvic drop  Lt: pelvic drop  Curl-up test: no distortion but significant difficulty   GAIT: Assistive device utilized: None Comments: WNL  POSTURE: rounded shoulders, forward head, increased lumbar lordosis, increased thoracic kyphosis, and anterior pelvic tilt, Rt upper/Lt lower curvature with Lt pelvic rotation and Lt  posterior innominate rotation   LUMBARAROM/PROM:  A/PROM A/PROM  Eval (% available)  Flexion 50, low back pain  Extension 25  Right lateral flexion 50  Left lateral flexion 50  Right rotation 75  Left rotation 25   (Blank rows = not tested)  PALPATION:   General: some tightness throughout thoracic/lumbar paraspinals   Pelvic Alignment: Lt posterior rotation; overall rotation of pelvis to the Lt  Abdominal: good mobility; apical breathing pattern                External Perineal Exam: dryness, phimosis, labial fusion                             Internal Pelvic Floor: dry, vaginal stenosis, some tenderness/stinging on Lt  Patient confirms identification and approves PT to assess internal pelvic floor and treatment Yes  PELVIC MMT:   MMT eval  Vaginal 2/5, tendency to bear down and had difficulty consistently drawing in; 4 second hold, 4 repeat contractions   Diastasis Recti WNL  (Blank rows = not tested)        TONE: low  PROLAPSE: WNL  TODAY'S TREATMENT:                                                                                                                              DATE:  07/06/23 Neuromuscular re-education: Transversus abdominus training with multimodal cues for improved motor control and breath coordination Transversus abdominus isometrics 12x Bil supine UE ball press with transversus abdominus and pelvic floor muscle contractions and breath coordination 10x Supine hip adduction ball press with  transversus abdominus and pelvic floor muscle contractions and breath coordination 10x Bridge with hip adduction, transversus abdominus, and pelvic floor muscle 2 x 10 Supine leg extensions 10x bil Supine weight drop 6 lbs 12x Seated hip abduction red band with transversus abdominus and pelvic floor muscle 2 x 10 Seated hip adduction ball press with transversus abdominus and pelvic floor muscle 2 x 10 Seated resisted march red band with transversus abdominus  and pelvic floor muscle 2 x 10  Exercises: Lower trunk rotation 2 x 10 Single knee to chest 5x bil Supine LE windshield wipers 2 x 10 Therapeutic activities: Double voiding    05/19/23  EVAL  Neuromuscular re-education: Pt provides verbal consent for internal vaginal/rectal pelvic floor exam. Internal vaginal pelvic floor muscle contraction training Quick flicks Long holds The knack Urge drill Therapeutic activities: Lubricants Vaginal moisturizers    PATIENT EDUCATION:  Education details: See above Person educated: Patient Education method: Explanation, Demonstration, Tactile cues, Verbal cues, and Handouts Education comprehension: verbalized understanding  HOME EXERCISE PROGRAM: [id number]  ASSESSMENT:  CLINICAL IMPRESSION: Pt already seeing some progress with improved bladder control. We did go over double voiding and making sure she is not straining to completely empty bladder. She was able to begin core strengthening with good coordination and breath control. HEP updated. She will continue to benefit from skilled PT intervention in order to decrease urinary incontinence, address all impairments, and progress functional strengthening program.   OBJECTIVE IMPAIRMENTS: decreased activity tolerance, decreased coordination, decreased endurance, decreased mobility, decreased ROM, decreased strength, increased fascial restrictions, increased muscle spasms, impaired flexibility, impaired tone, improper body mechanics, postural dysfunction, and pain.   ACTIVITY LIMITATIONS: continence  PARTICIPATION LIMITATIONS: community activity and occupation  PERSONAL FACTORS: 1 comorbidity: medical history are also affecting patient's functional outcome.   REHAB POTENTIAL: Good  CLINICAL DECISION MAKING: Stable/uncomplicated  EVALUATION COMPLEXITY: Low   GOALS: Goals reviewed with patient? Yes  SHORT TERM GOALS: Target date: 07/27/2023   Pt will be independent with HEP.    Baseline: Goal status: INITIAL  2.  Pt will report 25% improvement in urinary incontinence in order to prevent any unwanted odors.  Baseline:  Goal status: INITIAL  3.  Pt will be independent with the knack, urge suppression technique, and double voiding in order to improve bladder habits and decrease urinary incontinence.   Baseline:  Goal status: INITIAL  4.  Pt will increase all impaired lumbar A/ROM by 25% without pain in order to allow for better pelvic floor muscle strengthening.  Baseline:  Goal status: INITIAL   LONG TERM GOALS: Target date: 12/14/23  Pt will be independent with advanced HEP.   Baseline:  Goal status: INITIAL  2.  Pt will report 75% improvement in urinary incontinence in order to prevent any unwanted odors. Baseline:  Goal status: INITIAL  3.  Pt will report no leaks with laughing, coughing, sneezing in order to improve comfort with interpersonal relationships and community activities.   Baseline:  Goal status: INITIAL  4.  Pt will be able to perform squat without bil valgus knee collapse in order to demonstrate improved hip/core strength that will prevent worsening urinary incontinence.  Baseline:  Goal status: INITIAL  5.  Pt will be able to perform single leg stance without pelvic drop bil with UE support in order to demonstrate improved core strength that will prevent low back injury/pain.  Baseline:  Goal status: INITIAL   PLAN:  PT FREQUENCY: 1-2x/week  PT DURATION: 6 months  PLANNED INTERVENTIONS: 97110-Therapeutic  exercises, 97530- Therapeutic activity, [id number]- Neuromuscular re-education, 97535- Self Care, [id number]- Manual therapy, Dry Needling, and Biofeedback  PLAN FOR NEXT SESSION: Progress core training.   Josette Mares, PT, DPT05/07/251:17 PM

## 2023-07-13 ENCOUNTER — Ambulatory Visit: Admitting: Physical Therapy

## 2023-07-22 ENCOUNTER — Ambulatory Visit: Payer: Self-pay | Attending: Obstetrics and Gynecology | Admitting: Physical Therapy

## 2023-07-22 DIAGNOSIS — M6281 Muscle weakness (generalized): Secondary | ICD-10-CM | POA: Diagnosis present

## 2023-07-22 DIAGNOSIS — R279 Unspecified lack of coordination: Secondary | ICD-10-CM | POA: Diagnosis present

## 2023-07-22 DIAGNOSIS — R293 Abnormal posture: Secondary | ICD-10-CM | POA: Insufficient documentation

## 2023-07-22 NOTE — Therapy (Signed)
 OUTPATIENT PHYSICAL THERAPY FEMALE PELVIC TREATMENT   Patient Name: Rebecca Gentry MRN: [id number] DOB:10-14-1965, 58 y.o., female Today's Date: 07/22/2023  END OF SESSION:  PT End of Session - 07/22/23 0852     Visit Number 3    Date for PT Re-Evaluation 12/14/23    Authorization Type [employer]BS    PT Start Time 0847    PT Stop Time 0926    PT Time Calculation (min) 39 min    Activity Tolerance Patient tolerated treatment well    Behavior During Therapy [facility] for tasks assessed/performed            Past Medical History:  Diagnosis Date   Allergy    Anal fissure    Anemia    Anxiety    Arthritis    Asthma    Depression    Diverticulosis    Fibromyalgia    GERD (gastroesophageal reflux disease)    HTN (hypertension)    Hyperlipidemia 2023   Irritable bowel syndrome    Sinus arrhythmia    Sleep apnea    Past Surgical History:  Procedure Laterality Date   ABDOMINAL HYSTERECTOMY     partial   COLONOSCOPY     LEEP     UPPER GASTROINTESTINAL ENDOSCOPY  2015   There are no active problems to display for this patient.   PCP: Burney Darice CROME, MD  REFERRING PROVIDER: Craig Alan SAUNDERS, PA-C   REFERRING DIAG: R19.7 (ICD-10-CM) - Diarrhea, unspecified type K52.9 (ICD-10-CM) - Noninfectious gastroenteritis, unspecified type R10.32 (ICD-10-CM) - LLQ pain K21.9 (ICD-10-CM) - Gastroesophageal reflux disease, unspecified whether esophagitis present  THERAPY DIAG:  Muscle weakness (generalized)  Unspecified lack of coordination  Abnormal posture  Rationale for Evaluation and Treatment: Rehabilitation  ONSET DATE: August 2024  SUBJECTIVE:                                                                                                                                                                                           SUBJECTIVE STATEMENT: Pt reports has been doing urge drill and knack and has cut down on urine leakage some but less now drops. Is fearful of  travel plans coming up on 19th with bowel and bladder symptoms. Is having fecal smearing sometimes.    PAIN:  Are you having pain? No   PRECAUTIONS: None  RED FLAGS: None   WEIGHT BEARING RESTRICTIONS: No  FALLS:  Has patient fallen in last 6 months? No  OCCUPATION: daycare Data processing manager  ACTIVITY LEVEL : not currently   PLOF: Independent  PATIENT GOALS: additional support and preventative help with bladder leakage  PERTINENT HISTORY:  Abdominal hysterectomy (  partial), urethral bulking procedure, fibromyalgia    BOWEL MOVEMENT: Pain with bowel movement: No Type of bowel movement:Frequency 1x/day and Strain no Fully empty rectum: Yes:   Leakage: No Pads: No Fiber supplement/laxative No  URINATION: Pain with urination: No Fully empty bladder: Yes: but will have some post void dribbling  Stream: Strong Urgency: Yes - when coming home Frequency: every 2.5 hours; occasionally waking at night to urinate  Fluid Intake: not enough water; drinks a lot of gatorade  Leakage: Coughing, Sneezing, and Laughing Pads: Yes: typically one panty liner in a day   INTERCOURSE:  Ability to have vaginal penetration Yes  Pain with intercourse: occasionally DrynessYes - been on estradiol  since December  Climax: WNL Marinoff Scale: 1/3 Lubricant: sometimes   PREGNANCY: No children  PROLAPSE: None   OBJECTIVE:  Note: Objective measures were completed at Evaluation unless otherwise noted.   PATIENT SURVEYS:   PFIQ-7: 24  COGNITION: Overall cognitive status: Within functional limits for tasks assessed     SENSATION: Light touch: Appears intact   FUNCTIONAL TESTS:  Squat: bil valgus knee collapse Single leg stance:  Rt: pelvic drop  Lt: pelvic drop  Curl-up test: no distortion but significant difficulty   GAIT: Assistive device utilized: None Comments: WNL  POSTURE: rounded shoulders, forward head, increased lumbar lordosis, increased thoracic kyphosis,  and anterior pelvic tilt, Rt upper/Lt lower curvature with Lt pelvic rotation and Lt posterior innominate rotation   LUMBARAROM/PROM:  A/PROM A/PROM  Eval (% available)  Flexion 50, low back pain  Extension 25  Right lateral flexion 50  Left lateral flexion 50  Right rotation 75  Left rotation 25   (Blank rows = not tested)  PALPATION:   General: some tightness throughout thoracic/lumbar paraspinals   Pelvic Alignment: Lt posterior rotation; overall rotation of pelvis to the Lt  Abdominal: good mobility; apical breathing pattern                External Perineal Exam: dryness, phimosis, labial fusion                             Internal Pelvic Floor: dry, vaginal stenosis, some tenderness/stinging on Lt  Patient confirms identification and approves PT to assess internal pelvic floor and treatment Yes  PELVIC MMT:   MMT eval  Vaginal 2/5, tendency to bear down and had difficulty consistently drawing in; 4 second hold, 4 repeat contractions   Diastasis Recti WNL  (Blank rows = not tested)        TONE: low  PROLAPSE: WNL  TODAY'S TREATMENT:                                                                                                                              DATE:  07/22/23: Pt educated on voiding mechanics, promoting good skin integrity with wipe use instead of just toilet paper as pt reports she needs to use  a lot of paper post bowel movements with stool being more mushy. Urge drill for bowels as well Hooklying transverse abdominis activation with exhale - needed moderate cues for techniques and coordination x10 2x10 ball press with transverse abdominis activation  2x10 hooklying hip adduction with ball squeeze + pelvic floor contraction and transverse abdominis activation  2x10 10# Sit to stand with pelvic floor contraction and exhale Seated piriformis stretch 2x30s Seated hamstrings stretch 2x30s each  07/06/23 Neuromuscular re-education: Transversus  abdominus training with multimodal cues for improved motor control and breath coordination Transversus abdominus isometrics 12x Bil supine UE ball press with transversus abdominus and pelvic floor muscle contractions and breath coordination 10x Supine hip adduction ball press with transversus abdominus and pelvic floor muscle contractions and breath coordination 10x Bridge with hip adduction, transversus abdominus, and pelvic floor muscle 2 x 10 Supine leg extensions 10x bil Supine weight drop 6 lbs 12x Seated hip abduction red band with transversus abdominus and pelvic floor muscle 2 x 10 Seated hip adduction ball press with transversus abdominus and pelvic floor muscle 2 x 10 Seated resisted march red band with transversus abdominus and pelvic floor muscle 2 x 10  Exercises: Lower trunk rotation 2 x 10 Single knee to chest 5x bil Supine LE windshield wipers 2 x 10 Therapeutic activities: Double voiding    05/19/23  EVAL  Neuromuscular re-education: Pt provides verbal consent for internal vaginal/rectal pelvic floor exam. Internal vaginal pelvic floor muscle contraction training Quick flicks Long holds The knack Urge drill Therapeutic activities: Lubricants Vaginal moisturizers    PATIENT EDUCATION:  Education details: See above Person educated: Patient Education method: Explanation, Demonstration, Tactile cues, Verbal cues, and Handouts Education comprehension: verbalized understanding  HOME EXERCISE PROGRAM: [id number]  ASSESSMENT:  CLINICAL IMPRESSION: Pt presents for skilled PT treatment, tolerated well, progressing toward goals. Has been having less urine leakage but has had a little fecal smearing with softer stools. Educated on voiding mach  and She will continue to benefit from skilled PT intervention in order to decrease urinary incontinence, address all impairments, and progress functional strengthening program.   OBJECTIVE IMPAIRMENTS: decreased activity  tolerance, decreased coordination, decreased endurance, decreased mobility, decreased ROM, decreased strength, increased fascial restrictions, increased muscle spasms, impaired flexibility, impaired tone, improper body mechanics, postural dysfunction, and pain.   ACTIVITY LIMITATIONS: continence  PARTICIPATION LIMITATIONS: community activity and occupation  PERSONAL FACTORS: 1 comorbidity: medical history are also affecting patient's functional outcome.   REHAB POTENTIAL: Good  CLINICAL DECISION MAKING: Stable/uncomplicated  EVALUATION COMPLEXITY: Low   GOALS: Goals reviewed with patient? Yes  SHORT TERM GOALS: Target date: 07/27/2023   Pt will be independent with HEP.   Baseline: Goal status: INITIAL  2.  Pt will report 25% improvement in urinary incontinence in order to prevent any unwanted odors.  Baseline:  Goal status: INITIAL  3.  Pt will be independent with the knack, urge suppression technique, and double voiding in order to improve bladder habits and decrease urinary incontinence.   Baseline:  Goal status: INITIAL  4.  Pt will increase all impaired lumbar A/ROM by 25% without pain in order to allow for better pelvic floor muscle strengthening.  Baseline:  Goal status: INITIAL   LONG TERM GOALS: Target date: 12/14/23  Pt will be independent with advanced HEP.   Baseline:  Goal status: INITIAL  2.  Pt will report 75% improvement in urinary incontinence in order to prevent any unwanted odors. Baseline:  Goal status: INITIAL  3.  Pt will  report no leaks with laughing, coughing, sneezing in order to improve comfort with interpersonal relationships and community activities.   Baseline:  Goal status: INITIAL  4.  Pt will be able to perform squat without bil valgus knee collapse in order to demonstrate improved hip/core strength that will prevent worsening urinary incontinence.  Baseline:  Goal status: INITIAL  5.  Pt will be able to perform single leg  stance without pelvic drop bil with UE support in order to demonstrate improved core strength that will prevent low back injury/pain.  Baseline:  Goal status: INITIAL   PLAN:  PT FREQUENCY: 1-2x/week  PT DURATION: 6 months  PLANNED INTERVENTIONS: 97110-Therapeutic exercises, 97530- Therapeutic activity, [id number]- Neuromuscular re-education, 97535- Self Care, [id number]- Manual therapy, Dry Needling, and Biofeedback  PLAN FOR NEXT SESSION: Progress core training.   Darryle Navy, PT, DPT 07/10/259:31 AM

## 2023-07-29 ENCOUNTER — Encounter: Payer: Self-pay | Admitting: Physical Therapy

## 2023-08-10 ENCOUNTER — Ambulatory Visit: Admitting: Physical Therapy

## 2023-08-17 ENCOUNTER — Ambulatory Visit: Attending: Obstetrics and Gynecology | Admitting: Physical Therapy

## 2023-08-17 DIAGNOSIS — R293 Abnormal posture: Secondary | ICD-10-CM | POA: Diagnosis present

## 2023-08-17 DIAGNOSIS — M6281 Muscle weakness (generalized): Secondary | ICD-10-CM | POA: Diagnosis present

## 2023-08-17 DIAGNOSIS — R279 Unspecified lack of coordination: Secondary | ICD-10-CM | POA: Insufficient documentation

## 2023-08-17 NOTE — Therapy (Signed)
 OUTPATIENT PHYSICAL THERAPY FEMALE PELVIC TREATMENT   Patient Name: Rebecca Gentry MRN: [id number] DOB:07-12-1965, 58 y.o., female Today's Date: 08/17/2023  END OF SESSION:  PT End of Session - 08/17/23 0755     Visit Number 4    Date for PT Re-Evaluation 12/14/23    Authorization Type [employer]    PT Start Time 0800    PT Stop Time 0839    PT Time Calculation (min) 39 min    Activity Tolerance Patient tolerated treatment well    Behavior During Therapy WFL for tasks assessed/performed            Past Medical History:  Diagnosis Date   Allergy    Anal fissure    Anemia    Anxiety    Arthritis    Asthma    Depression    Diverticulosis    Fibromyalgia    GERD (gastroesophageal reflux disease)    HTN (hypertension)    Hyperlipidemia 2023   Irritable bowel syndrome    Sinus arrhythmia    Sleep apnea    Past Surgical History:  Procedure Laterality Date   ABDOMINAL HYSTERECTOMY     partial   COLONOSCOPY     LEEP     UPPER GASTROINTESTINAL ENDOSCOPY  2015   There are no active problems to display for this patient.   PCP: Burney Darice CROME, MD  REFERRING PROVIDER: Craig Alan SAUNDERS, PA-C   REFERRING DIAG: R19.7 (ICD-10-CM) - Diarrhea, unspecified type K52.9 (ICD-10-CM) - Noninfectious gastroenteritis, unspecified type R10.32 (ICD-10-CM) - LLQ pain K21.9 (ICD-10-CM) - Gastroesophageal reflux disease, unspecified whether esophagitis present  THERAPY DIAG:  Muscle weakness (generalized)  Unspecified lack of coordination  Abnormal posture  Rationale for Evaluation and Treatment: Rehabilitation  ONSET DATE: August 2024  SUBJECTIVE:                                                                                                                                                                                           SUBJECTIVE STATEMENT: Was able to go on vacation with flying and this all went well. Leakage is doing a lot better with bowel and bladder. Now  only happening once in awhile after urination and stands then has a few drops of urine. No fecal smearing.    PAIN:  Are you having pain? No   PRECAUTIONS: None  RED FLAGS: None   WEIGHT BEARING RESTRICTIONS: No  FALLS:  Has patient fallen in last 6 months? No  OCCUPATION: daycare Data processing manager  ACTIVITY LEVEL : not currently   PLOF: Independent  PATIENT GOALS: additional support and preventative help with bladder leakage  PERTINENT HISTORY:  Abdominal hysterectomy (partial), urethral bulking procedure, fibromyalgia    BOWEL MOVEMENT: Pain with bowel movement: No Type of bowel movement:Frequency 1x/day and Strain no Fully empty rectum: Yes:   Leakage: No Pads: No Fiber supplement/laxative No  URINATION: Pain with urination: No Fully empty bladder: Yes: but will have some post void dribbling  Stream: Strong Urgency: Yes - when coming home Frequency: every 2.5 hours; occasionally waking at night to urinate  Fluid Intake: not enough water; drinks a lot of gatorade  Leakage: Coughing, Sneezing, and Laughing Pads: Yes: typically one panty liner in a day   INTERCOURSE:  Ability to have vaginal penetration Yes  Pain with intercourse: occasionally DrynessYes - been on estradiol  since December  Climax: WNL Marinoff Scale: 1/3 Lubricant: sometimes   PREGNANCY: No children  PROLAPSE: None   OBJECTIVE:  Note: Objective measures were completed at Evaluation unless otherwise noted.   PATIENT SURVEYS:   PFIQ-7: 24  COGNITION: Overall cognitive status: Within functional limits for tasks assessed     SENSATION: Light touch: Appears intact   FUNCTIONAL TESTS:  Squat: bil valgus knee collapse Single leg stance:  Rt: pelvic drop  Lt: pelvic drop  Curl-up test: no distortion but significant difficulty   GAIT: Assistive device utilized: None Comments: WNL  POSTURE: rounded shoulders, forward head, increased lumbar lordosis, increased thoracic  kyphosis, and anterior pelvic tilt, Rt upper/Lt lower curvature with Lt pelvic rotation and Lt posterior innominate rotation   LUMBARAROM/PROM:  A/PROM A/PROM  Eval (% available)  Flexion 50, low back pain  Extension 25  Right lateral flexion 50  Left lateral flexion 50  Right rotation 75  Left rotation 25   (Blank rows = not tested)  PALPATION:   General: some tightness throughout thoracic/lumbar paraspinals   Pelvic Alignment: Lt posterior rotation; overall rotation of pelvis to the Lt  Abdominal: good mobility; apical breathing pattern                External Perineal Exam: dryness, phimosis, labial fusion                             Internal Pelvic Floor: dry, vaginal stenosis, some tenderness/stinging on Lt  Patient confirms identification and approves PT to assess internal pelvic floor and treatment Yes  PELVIC MMT:   MMT eval  Vaginal 2/5, tendency to bear down and had difficulty consistently drawing in; 4 second hold, 4 repeat contractions   Diastasis Recti WNL  (Blank rows = not tested)        TONE: low  PROLAPSE: WNL  TODAY'S TREATMENT:                                                                                                                              DATE:  08/17/23: Hooklying red loop 2x10 with transverse abdominis activation and exhale Hooklying marching red loop 2x10 with exhale Opposite hand/knee ball press 2x10  hooklying with exhale and transverse abdominis activation  2x10 15# sit to stands with pelvic floor contraction and exhale Standing mario punches 5# 2x10 each  Farmers carry 15# 1000' switched hand and completed again Single knee chest 3x30s each Butterfly stretch 3x30s   07/22/23: Pt educated on voiding mechanics, promoting good skin integrity with wipe use instead of just toilet paper as pt reports she needs to use a lot of paper post bowel movements with stool being more mushy. Urge drill for bowels as well Hooklying transverse  abdominis activation with exhale - needed moderate cues for techniques and coordination x10 2x10 ball press with transverse abdominis activation  2x10 hooklying hip adduction with ball squeeze + pelvic floor contraction and transverse abdominis activation  2x10 10# Sit to stand with pelvic floor contraction and exhale Seated piriformis stretch 2x30s Seated hamstrings stretch 2x30s each  07/06/23 Neuromuscular re-education: Transversus abdominus training with multimodal cues for improved motor control and breath coordination Transversus abdominus isometrics 12x Bil supine UE ball press with transversus abdominus and pelvic floor muscle contractions and breath coordination 10x Supine hip adduction ball press with transversus abdominus and pelvic floor muscle contractions and breath coordination 10x Bridge with hip adduction, transversus abdominus, and pelvic floor muscle 2 x 10 Supine leg extensions 10x bil Supine weight drop 6 lbs 12x Seated hip abduction red band with transversus abdominus and pelvic floor muscle 2 x 10 Seated hip adduction ball press with transversus abdominus and pelvic floor muscle 2 x 10 Seated resisted march red band with transversus abdominus and pelvic floor muscle 2 x 10  Exercises: Lower trunk rotation 2 x 10 Single knee to chest 5x bil Supine LE windshield wipers 2 x 10 Therapeutic activities: Double voiding      PATIENT EDUCATION:  Education details: See above Person educated: Patient Education method: Programmer, multimedia, Demonstration, Actor cues, Verbal cues, and Handouts Education comprehension: verbalized understanding  HOME EXERCISE PROGRAM: [id number]  ASSESSMENT:  CLINICAL IMPRESSION: Pt presents for skilled PT treatment, tolerated well, progressing toward goals. Has been having much less urine leakage and no fecal leakage. Tolerated session well with progression of exercises today.  She will continue to benefit from skilled PT intervention in  order to decrease urinary incontinence, address all impairments, and progress functional strengthening program.   OBJECTIVE IMPAIRMENTS: decreased activity tolerance, decreased coordination, decreased endurance, decreased mobility, decreased ROM, decreased strength, increased fascial restrictions, increased muscle spasms, impaired flexibility, impaired tone, improper body mechanics, postural dysfunction, and pain.   ACTIVITY LIMITATIONS: continence  PARTICIPATION LIMITATIONS: community activity and occupation  PERSONAL FACTORS: 1 comorbidity: medical history are also affecting patient's functional outcome.   REHAB POTENTIAL: Good  CLINICAL DECISION MAKING: Stable/uncomplicated  EVALUATION COMPLEXITY: Low   GOALS: Goals reviewed with patient? Yes  SHORT TERM GOALS: Target date: 07/27/2023 *goals updated 08/17/23  Pt will be independent with HEP.   Baseline: Goal status: MET  2.  Pt will report 25% improvement in urinary incontinence in order to prevent any unwanted odors.  Baseline:  Goal status: MET  3.  Pt will be independent with the knack, urge suppression technique, and double voiding in order to improve bladder habits and decrease urinary incontinence.   Baseline:  Goal status: MET  4.  Pt will increase all impaired lumbar A/ROM by 25% without pain in order to allow for better pelvic floor muscle strengthening.  Baseline:  Goal status: MET   LONG TERM GOALS: Target date: 12/14/23  Pt will be independent with advanced HEP.  Baseline:  Goal status: on going  2.  Pt will report 75% improvement in urinary incontinence in order to prevent any unwanted odors. Baseline:  Goal status: MET  3.  Pt will report no leaks with laughing, coughing, sneezing in order to improve comfort with interpersonal relationships and community activities.   Baseline:  Goal status: MET  4.  Pt will be able to perform squat without bil valgus knee collapse in order to demonstrate  improved hip/core strength that will prevent worsening urinary incontinence.  Baseline:  Goal status: INITIAL  5.  Pt will be able to perform single leg stance without pelvic drop bil with UE support in order to demonstrate improved core strength that will prevent low back injury/pain.  Baseline:  Goal status: INITIAL   PLAN:  PT FREQUENCY: 1-2x/week  PT DURATION: 6 months  PLANNED INTERVENTIONS: 97110-Therapeutic exercises, 97530- Therapeutic activity, [id number]- Neuromuscular re-education, 97535- Self Care, [id number]- Manual therapy, Dry Needling, and Biofeedback  PLAN FOR NEXT SESSION: Progress core training.   Darryle Navy, PT, DPT 08/16/2508:10 AM

## 2023-08-24 ENCOUNTER — Ambulatory Visit: Admitting: Physical Therapy

## 2023-08-24 DIAGNOSIS — R279 Unspecified lack of coordination: Secondary | ICD-10-CM

## 2023-08-24 DIAGNOSIS — M6281 Muscle weakness (generalized): Secondary | ICD-10-CM | POA: Diagnosis not present

## 2023-08-24 DIAGNOSIS — R293 Abnormal posture: Secondary | ICD-10-CM

## 2023-08-24 NOTE — Therapy (Signed)
 OUTPATIENT PHYSICAL THERAPY FEMALE PELVIC TREATMENT   Patient Name: Rebecca Gentry MRN: [id number] DOB:01/16/1965, 58 y.o., female Today's Date: 08/24/2023  END OF SESSION:  PT End of Session - 08/24/23 0757     Visit Number 5    Date for PT Re-Evaluation 12/14/23    Authorization Type [employer]    PT Start Time 0801    PT Stop Time 0840    PT Time Calculation (min) 39 min    Activity Tolerance Patient tolerated treatment well    Behavior During Therapy WFL for tasks assessed/performed            Past Medical History:  Diagnosis Date   Allergy    Anal fissure    Anemia    Anxiety    Arthritis    Asthma    Depression    Diverticulosis    Fibromyalgia    GERD (gastroesophageal reflux disease)    HTN (hypertension)    Hyperlipidemia 2023   Irritable bowel syndrome    Sinus arrhythmia    Sleep apnea    Past Surgical History:  Procedure Laterality Date   ABDOMINAL HYSTERECTOMY     partial   COLONOSCOPY     LEEP     UPPER GASTROINTESTINAL ENDOSCOPY  2015   There are no active problems to display for this patient.   PCP: Burney Darice CROME, MD  REFERRING PROVIDER: Craig Alan SAUNDERS, PA-C   REFERRING DIAG: R19.7 (ICD-10-CM) - Diarrhea, unspecified type K52.9 (ICD-10-CM) - Noninfectious gastroenteritis, unspecified type R10.32 (ICD-10-CM) - LLQ pain K21.9 (ICD-10-CM) - Gastroesophageal reflux disease, unspecified whether esophagitis present  THERAPY DIAG:  Muscle weakness (generalized)  Unspecified lack of coordination  Abnormal posture  Rationale for Evaluation and Treatment: Rehabilitation  ONSET DATE: August 2024  SUBJECTIVE:                                                                                                                                                                                           SUBJECTIVE STATEMENT: Hasn't been having any post void dripping, bowel movement are more consistent type 4-5s, no leakage or loose stools no  straining or urgency.   PAIN:  Are you having pain? No   PRECAUTIONS: None  RED FLAGS: None   WEIGHT BEARING RESTRICTIONS: No  FALLS:  Has patient fallen in last 6 months? No  OCCUPATION: daycare Data processing manager  ACTIVITY LEVEL : not currently   PLOF: Independent  PATIENT GOALS: additional support and preventative help with bladder leakage  PERTINENT HISTORY:  Abdominal hysterectomy (partial), urethral bulking procedure, fibromyalgia    BOWEL MOVEMENT: Pain with bowel movement: No Type of bowel  movement:Frequency 1x/day and Strain no Fully empty rectum: Yes:   Leakage: No Pads: No Fiber supplement/laxative No  URINATION: Pain with urination: No Fully empty bladder: Yes: but will have some post void dribbling  Stream: Strong Urgency: Yes - when coming home Frequency: every 2.5 hours; occasionally waking at night to urinate  Fluid Intake: not enough water; drinks a lot of gatorade  Leakage: Coughing, Sneezing, and Laughing Pads: Yes: typically one panty liner in a day   INTERCOURSE:  Ability to have vaginal penetration Yes  Pain with intercourse: occasionally DrynessYes - been on estradiol  since December  Climax: WNL Marinoff Scale: 1/3 Lubricant: sometimes   PREGNANCY: No children  PROLAPSE: None   OBJECTIVE:  Note: Objective measures were completed at Evaluation unless otherwise noted.   PATIENT SURVEYS:   PFIQ-7: 24  COGNITION: Overall cognitive status: Within functional limits for tasks assessed     SENSATION: Light touch: Appears intact   FUNCTIONAL TESTS:  Squat: bil valgus knee collapse Single leg stance:  Rt: pelvic drop  Lt: pelvic drop  Curl-up test: no distortion but significant difficulty   GAIT: Assistive device utilized: None Comments: WNL  POSTURE: rounded shoulders, forward head, increased lumbar lordosis, increased thoracic kyphosis, and anterior pelvic tilt, Rt upper/Lt lower curvature with Lt pelvic rotation  and Lt posterior innominate rotation   LUMBARAROM/PROM:  A/PROM A/PROM  Eval (% available)  Flexion 50, low back pain  Extension 25  Right lateral flexion 50  Left lateral flexion 50  Right rotation 75  Left rotation 25   (Blank rows = not tested)  PALPATION:   General: some tightness throughout thoracic/lumbar paraspinals   Pelvic Alignment: Lt posterior rotation; overall rotation of pelvis to the Lt  Abdominal: good mobility; apical breathing pattern                External Perineal Exam: dryness, phimosis, labial fusion                             Internal Pelvic Floor: dry, vaginal stenosis, some tenderness/stinging on Lt  Patient confirms identification and approves PT to assess internal pelvic floor and treatment Yes  PELVIC MMT:   MMT eval  Vaginal 2/5, tendency to bear down and had difficulty consistently drawing in; 4 second hold, 4 repeat contractions   Diastasis Recti WNL  (Blank rows = not tested)        TONE: low  PROLAPSE: WNL  TODAY'S TREATMENT:                                                                                                                              DATE:   08/24/23: Bridges 2x10 + pelvic floor contraction and exhale Tabletop opposite hand/knee ball press 2x10 > fatigued after first set returned to feet on table 2x10 15# squats Black loop fire hydrants 2x10 Lateral stepping black loop 4x10'  Farmer's carry 15#  1000' each hand Single leg stance 10s each  08/17/23: Hooklying red loop 2x10 with transverse abdominis activation and exhale Hooklying marching red loop 2x10 with exhale Opposite hand/knee ball press 2x10 hooklying with exhale and transverse abdominis activation  2x10 15# sit to stands with pelvic floor contraction and exhale Standing mario punches 5# 2x10 each  Farmers carry 15# 1000' switched hand and completed again Single knee chest 3x30s each Butterfly stretch 3x30s   07/22/23: Pt educated on voiding  mechanics, promoting good skin integrity with wipe use instead of just toilet paper as pt reports she needs to use a lot of paper post bowel movements with stool being more mushy. Urge drill for bowels as well Hooklying transverse abdominis activation with exhale - needed moderate cues for techniques and coordination x10 2x10 ball press with transverse abdominis activation  2x10 hooklying hip adduction with ball squeeze + pelvic floor contraction and transverse abdominis activation  2x10 10# Sit to stand with pelvic floor contraction and exhale Seated piriformis stretch 2x30s Seated hamstrings stretch 2x30s each    PATIENT EDUCATION:  Education details: See above Person educated: Patient Education method: Explanation, Demonstration, Tactile cues, Verbal cues, and Handouts Education comprehension: verbalized understanding  HOME EXERCISE PROGRAM: [id number]  ASSESSMENT:  CLINICAL IMPRESSION: Pt presents for skilled PT treatment, tolerated well, progressing toward goals. Has been having much less urine leakage and no fecal leakage. Tolerated session well with progression of exercises today.  She will continue to benefit from skilled PT intervention in order to decrease urinary incontinence, address all impairments, and progress functional strengthening program.   OBJECTIVE IMPAIRMENTS: decreased activity tolerance, decreased coordination, decreased endurance, decreased mobility, decreased ROM, decreased strength, increased fascial restrictions, increased muscle spasms, impaired flexibility, impaired tone, improper body mechanics, postural dysfunction, and pain.   ACTIVITY LIMITATIONS: continence  PARTICIPATION LIMITATIONS: community activity and occupation  PERSONAL FACTORS: 1 comorbidity: medical history are also affecting patient's functional outcome.   REHAB POTENTIAL: Good  CLINICAL DECISION MAKING: Stable/uncomplicated  EVALUATION COMPLEXITY: Low   GOALS: Goals reviewed with  patient? Yes  SHORT TERM GOALS: Target date: 07/27/2023 *goals updated 08/17/23  Pt will be independent with HEP.   Baseline: Goal status: MET  2.  Pt will report 25% improvement in urinary incontinence in order to prevent any unwanted odors.  Baseline:  Goal status: MET  3.  Pt will be independent with the knack, urge suppression technique, and double voiding in order to improve bladder habits and decrease urinary incontinence.   Baseline:  Goal status: MET  4.  Pt will increase all impaired lumbar A/ROM by 25% without pain in order to allow for better pelvic floor muscle strengthening.  Baseline:  Goal status: MET   LONG TERM GOALS: Target date: 12/14/23  Pt will be independent with advanced HEP.   Baseline:  Goal status: MET  2.  Pt will report 75% improvement in urinary incontinence in order to prevent any unwanted odors. Baseline:  Goal status: MET  3.  Pt will report no leaks with laughing, coughing, sneezing in order to improve comfort with interpersonal relationships and community activities.   Baseline:  Goal status: MET  4.  Pt will be able to perform squat without bil valgus knee collapse in order to demonstrate improved hip/core strength that will prevent worsening urinary incontinence.  Baseline:  Goal status: MET  5.  Pt will be able to perform single leg stance without pelvic drop bil with UE support in order to demonstrate improved core strength that  will prevent low back injury/pain.  Baseline:  Goal status: MET   PLAN:  PT FREQUENCY: 1-2x/week  PT DURATION: 6 months  PLANNED INTERVENTIONS: 97110-Therapeutic exercises, 97530- Therapeutic activity, 97112- Neuromuscular re-education, 97535- Self Care, [id number]- Manual therapy, Dry Needling, and Biofeedback  PLAN FOR NEXT SESSION:  PHYSICAL THERAPY DISCHARGE SUMMARY  Visits from Start of Care: 5  Current functional level related to goals / functional outcomes: All goals met   Remaining  deficits: None per pt    Education / Equipment: HEP   Patient agrees to discharge. Patient goals were met. Patient is being discharged due to meeting the stated rehab goals.   Darryle Navy, PT, DPT 08/12/258:46 AM

## 2023-09-17 ENCOUNTER — Ambulatory Visit
Admission: RE | Admit: 2023-09-17 | Discharge: 2023-09-17 | Disposition: A | Discharge status: Home / Self Care | Source: Ambulatory Visit | Attending: Family Medicine | Admitting: Family Medicine

## 2023-09-17 DIAGNOSIS — Z1231 Encounter for screening mammogram for malignant neoplasm of breast: Secondary | ICD-10-CM
